# Patient Record
Sex: Female | Born: 1965
Health system: Southern US, Community
[De-identification: ages and names within clinical notes are randomized; demographics above are authoritative.]

## PROBLEM LIST (undated history)

## (undated) DIAGNOSIS — K219 Gastro-esophageal reflux disease without esophagitis: Secondary | ICD-10-CM

## (undated) HISTORY — PX: BACK SURGERY: SHX140

---

## 1998-01-01 ENCOUNTER — Other Ambulatory Visit: Admission: RE | Admit: 1998-01-01 | Discharge: 1998-01-01 | Payer: Self-pay | Admitting: Obstetrics & Gynecology

## 2002-10-01 ENCOUNTER — Encounter: Payer: Self-pay | Admitting: *Deleted

## 2002-10-01 ENCOUNTER — Encounter (HOSPITAL_COMMUNITY): Admission: RE | Admit: 2002-10-01 | Discharge: 2002-10-31 | Payer: Self-pay | Admitting: *Deleted

## 2006-10-23 ENCOUNTER — Ambulatory Visit (HOSPITAL_COMMUNITY): Admission: RE | Admit: 2006-10-23 | Discharge: 2006-10-23 | Payer: Self-pay | Admitting: Family Medicine

## 2006-12-24 ENCOUNTER — Inpatient Hospital Stay (HOSPITAL_COMMUNITY): Admission: RE | Admit: 2006-12-24 | Discharge: 2006-12-25 | Payer: Self-pay | Admitting: Neurosurgery

## 2010-11-29 NOTE — Op Note (Signed)
NAMEKEVEN, SOUCY              ACCOUNT NO.:  [id number]   MEDICAL RECORD NO.:  [id number]          PATIENT TYPE:  INP   LOCATION:  3172                         FACILITY:  [facility]   PHYSICIAN:  Hewitt Shorts, M.D.DATE OF BIRTH:  1965-10-18   DATE OF PROCEDURE:  12/24/2006  DATE OF DISCHARGE:                               OPERATIVE REPORT   PREOPERATIVE DIAGNOSIS:  Right L5-S1 lumbar disk herniation, lumbar  spondylosis, lumbar degenerative disk disease and lumbar radiculopathy.   POSTOPERATIVE DIAGNOSIS:  Right L5-S1 lumbar disk herniation, lumbar  spondylosis, lumbar degenerative disk disease and lumbar radiculopathy.   PROCEDURE:  Right L5-S1 lumbar laminotomy and microdiskectomy with  microdissection.   SURGEON:  Hewitt Shorts, M.D.   ASSISTANT:  Nelia Shi. Webb Silversmith, RN and Clydene Fake, M.D.   ANESTHESIA:  General endotracheal.   INDICATIONS:  The patient is a 45 year old woman who presented with a  right lumbar radiculopathy.  MRI scan revealed small-like disk  herniation in a broad-based fashion central to the right edge of L5-S1  with fragment, had migrated caudally behind the body of S1 compressing  the exiting right S1 nerve root.  Decision made to proceed with  laminotomy and microdiskectomy.   DESCRIPTION OF PROCEDURE:  The patient was brought to the operating room  and placed under general endotracheal anesthesia.  The patient was  turned to a prone position.  Lumbar region was prepped with Betadine  soap and solution and draped in a sterile fashion.  The midline was  treated with local anesthesia with epinephrine.  An x-ray was taken at  the L5-S1 level identified and dissection was carried down to the  subcutaneous tissue.  Bipolar cautery, electrocautery was used to  maintain hemostasis.  Dissection was carried down to the lumbar fascia  which was incised in the right side of the midline of the paraspinous  muscles.  Resection of spinous process and  lamina in a subperiosteal  fashion.  The L5-S1 interlaminar space was identified, localized and x-  rays taken and localization confirmed.  We then draped the microscope  which was brought into the field and provided magnification and  illumination and visualization and the remainder of decompression was  performed using microdissection, microsurgical technique.   A laminotomy was performed using the Ex-Max drill and Kerrison punches.  Ligament of flavum was carefully removed and we identified the thecal  sac and exiting right S1 nerve root.  These structures were gently  retracted medially and a small-like disk herniation at the disk space  level was identified as well as the fragment that had migrated caudally  behind the body of S1 compressing the exiting right S1 nerve root.   Diskectomy was begun by removing the free fragment that migrated  caudally and then we proceeded with incision of the annulus with  thorough diskectomy using a variety of microcurets and pituitary  rongeurs.  There was significant spondylytic degeneration of the disk  and we were able to remove the disk herniation and decompress the thecal  sac and nerve root.  In the end a thorough diskectomy was performed  using a variety of instruments and good decompression of the neural  structures was achieved.  We then irrigated the wound with bacitracin  solution establishing hemostasis with the use of bipolar cautery and  then once decompression was completed, hemostasis was established, we  instilled 2 mL of fentanyl, 80 mg of Depo-Medrol into the epidural space  and proceeded with closure.  The deep fascia closed with interrupted  undyed 1 Vicryl suture, Scarpa's fascia closed with interrupted undyed 1  Vicryl sutures, the subcutaneous subcuticular closed with inverted 2-0  and 3-0 undyed Vicryl sutures and the skin was approximated with  Dermabond.  The procedure was tolerated well.  The estimated blood loss  was  25 mL.  Sponge count correct.  Following surgery the patient was to  be turned back to supine position, reversed from anesthetized, extubated  and transferred to the recovery room for further care.      Hewitt Shorts, M.D.  Electronically Signed     RWN/MEDQ  D:  12/24/2006  T:  12/24/2006  Job:  [id number]

## 2010-12-02 NOTE — Procedures (Signed)
   Danielle Duke, Danielle Duke                        ACCOUNT NO.:  [id number]   MEDICAL RECORD NO.:  [id number]                    PATIENT TYPE:   LOCATION:                                       FACILITY:   PHYSICIAN:  Kem Boroughs, M.D.                 DATE OF BIRTH:   DATE OF PROCEDURE:  10/01/2002  DATE OF DISCHARGE:                                    STRESS TEST   INDICATIONS:  The patient is a 45 year old white female who was referred for  evaluation of chest discomfort.  She was subsequently referred for nuclear  stress test today.   Baseline 12-lead electrocardiogram reveals normal sinus rhythm.  Resting  blood pressure was 110/62 with a pulse of 84 beats per minute.  The patient  walked for eight minutes on a full Bruce protocol surpassing her target  heart rate of 156 beats per minute and proceeding to a peak heart rate of  161 beats per minute for 88% of her predicted maximum.  She achieved a peak  work load of 10.1 METS.  She denied any chest discomfort with this  procedure.  She did stop secondary to shortness of breath and lower  extremity fatigue.  There were no significant arrhythmias noted during this  procedure.  Electrocardiogram at peak heart rate revealed 1 mm upsloping ST  depression inferiorly and anteriorly (V3-V5).  She recovered within two  minutes of recovery back to normal sinus rhythm.  Her ST depression  normalized within this time frame as well.  She suffered no further  symptoms.   IMPRESSION:  1. Electrocardiogram negative for ischemia.  2. Clinically negative for angina.  3. Overall reasonable exercise tolerance.  4. Scintigraphic images are pending.                                               Kem Boroughs, M.D.    TB/MEDQ  D:  10/01/2002  T:  10/01/2002  Job:  [id number]   cc:   Corrie Mckusick, M.D.  [address] [place]. [address]  [place]  [place] [address]  Fax: [phone]

## 2011-05-04 LAB — CBC
HCT: 36.1
Hemoglobin: 12.3
MCHC: 34.1
MCV: 86.9
Platelets: 245
RBC: 4.15
RDW: 14.3 — ABNORMAL HIGH
WBC: 6.7

## 2011-05-04 LAB — HCG, SERUM, QUALITATIVE: Preg, Serum: NEGATIVE

## 2011-12-07 ENCOUNTER — Ambulatory Visit (INDEPENDENT_AMBULATORY_CARE_PROVIDER_SITE_OTHER): Payer: 59 | Admitting: Family Medicine

## 2011-12-07 ENCOUNTER — Ambulatory Visit: Payer: 59

## 2011-12-07 VITALS — BP 96/60 | HR 68 | Temp 98.5°F | Resp 16 | Ht 66.25 in | Wt 181.0 lb

## 2011-12-07 DIAGNOSIS — Z0000 Encounter for general adult medical examination without abnormal findings: Secondary | ICD-10-CM

## 2011-12-07 DIAGNOSIS — M79646 Pain in unspecified finger(s): Secondary | ICD-10-CM

## 2011-12-07 DIAGNOSIS — S6990XA Unspecified injury of unspecified wrist, hand and finger(s), initial encounter: Secondary | ICD-10-CM

## 2011-12-07 DIAGNOSIS — S6991XA Unspecified injury of right wrist, hand and finger(s), initial encounter: Secondary | ICD-10-CM

## 2011-12-07 DIAGNOSIS — Z23 Encounter for immunization: Secondary | ICD-10-CM

## 2011-12-07 DIAGNOSIS — M79609 Pain in unspecified limb: Secondary | ICD-10-CM

## 2011-12-07 LAB — POCT CBC
Granulocyte percent: 68.2 %G (ref 37–80)
HCT, POC: 42.1 % (ref 37.7–47.9)
Lymph, poc: 1.9 (ref 0.6–3.4)
MCHC: 32.5 g/dL (ref 31.8–35.4)
MID (cbc): 0.4 (ref 0–0.9)
MPV: 9.7 fL (ref 0–99.8)
POC Granulocyte: 4.9 (ref 2–6.9)
POC LYMPH PERCENT: 26.3 %L (ref 10–50)
POC MID %: 5.5 %M (ref 0–12)
Platelet Count, POC: 242 10*3/uL (ref 142–424)
RDW, POC: 15.3 %

## 2011-12-07 LAB — COMPREHENSIVE METABOLIC PANEL
AST: 16 U/L (ref 0–37)
Albumin: 4.5 g/dL (ref 3.5–5.2)
BUN: 12 mg/dL (ref 6–23)
CO2: 26 mEq/L (ref 19–32)
Calcium: 9.1 mg/dL (ref 8.4–10.5)
Chloride: 105 mEq/L (ref 96–112)
Creat: 0.73 mg/dL (ref 0.50–1.10)
Glucose, Bld: 87 mg/dL (ref 70–99)
Potassium: 4.1 mEq/L (ref 3.5–5.3)

## 2011-12-07 LAB — LIPID PANEL
Cholesterol: 142 mg/dL (ref 0–200)
Triglycerides: 128 mg/dL (ref <150)

## 2011-12-07 NOTE — Progress Notes (Signed)
  Subjective:    Patient ID: Danielle Duke, female    DOB: [birth year], 46 y.o.   MRN: [id number]  HPI Danielle Duke is a 46 y.o. female Here for physical  - new to our office.   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  No known med problems.  No recent labs, no recent primary care.  Last physical 1 year ago - at health dept.  Last ppd July 2012 - normal.   See immunization record/forms.  Had 1st 2 doses of Hep B - last dose September, due in March - will have done today.  MMR titer - 11/30/10 - immune by report (had varicella and MMR at [facility]. Health dept.) Back surgery - 3 or 4 years ago.  Cleared for work.  Doing ok.  R 3rd finger pain -caught on toilet as fell through floor in bathroom, 1 week ago, finger folded in.  Bruising and swollen - no prior eval. Has been using foldover brace.  Sore into hand at times.  R hand dominant.  Sister diagnosed with melanoma.  No new moles.  Derm: Dr. Margo Aye - last ov few years ago.     Last pap - few years ago - normal.  Fasting this am. Borderline   Review of Systems See scanned copy of PHS - R 3rd finger pain, no other concerns.     Objective:   Physical Exam  Constitutional: She is oriented to person, place, and time. She appears well-developed and well-nourished.  HENT:  Head: Normocephalic and atraumatic.  Right Ear: External ear normal.  Left Ear: External ear normal.  Mouth/Throat: Oropharynx is clear and moist.  Eyes: Conjunctivae and EOM are normal. Pupils are equal, round, and reactive to light.  Neck: Normal range of motion. No tracheal deviation present. No thyromegaly present.  Cardiovascular: Normal rate, regular rhythm, normal heart sounds and intact distal pulses.   No murmur heard. Pulmonary/Chest: Effort normal and breath sounds normal. No respiratory distress.  Abdominal: Soft. Bowel sounds are normal. There is no tenderness. There is no rebound.    Musculoskeletal: Normal range of motion. She exhibits no edema and no tenderness.       Hands: Lymphadenopathy:    She has no cervical adenopathy.  Neurological: She is alert and oriented to person, place, and time.  Skin: Skin is warm and dry. No rash noted. No erythema.  Psychiatric: She has a normal mood and affect. Her behavior is normal.    [facility] reading (PRIMARY) by  Dr. Neva Seat: R 3rd finger. Small volar plate avulsion at base of 3rd middle phalynx.  No apparent incongruity/subluxation      Assessment & Plan:  [name] is a 46 y.o. female CPE - check pap2, declined std testing.  Check CMP, lipids, cbc.  Can schedule repeat mammogram on own.  Follow up with dermatologist for mole mapping with FH melanoma. HepB vaccine # 3 given Ppd placed - read in 48-72 hours. Paperwork for school completed - see scanned copy.  Pt to obtain copy of varicella and MMR titers for school.    R 3rd finger pain/swelling- suspected volar plate avulsion fracture without apparent subluxation.  - buddy taped with dorsal padded splint, in slight flexion for next 4-5  days, then rom as tolerated.  Recheck next 2 weeks. Advised that swelling may persist for 6-12 months.

## 2011-12-09 ENCOUNTER — Encounter (INDEPENDENT_AMBULATORY_CARE_PROVIDER_SITE_OTHER): Payer: 59

## 2011-12-09 DIAGNOSIS — Z111 Encounter for screening for respiratory tuberculosis: Secondary | ICD-10-CM

## 2011-12-09 LAB — TB SKIN TEST: Induration: 0 mm

## 2011-12-15 LAB — PAP IG W/ RFLX HPV ASCU

## 2011-12-16 ENCOUNTER — Encounter: Payer: Self-pay | Admitting: *Deleted

## 2013-04-30 ENCOUNTER — Ambulatory Visit (INDEPENDENT_AMBULATORY_CARE_PROVIDER_SITE_OTHER): Payer: 59 | Admitting: Family Medicine

## 2013-04-30 ENCOUNTER — Encounter: Payer: Self-pay | Admitting: Family Medicine

## 2013-04-30 VITALS — BP 120/72 | Temp 98.3°F | Ht 67.0 in | Wt 184.8 lb

## 2013-04-30 DIAGNOSIS — J329 Chronic sinusitis, unspecified: Secondary | ICD-10-CM

## 2013-04-30 MED ORDER — AMOXICILLIN-POT CLAVULANATE 875-125 MG PO TABS: 1.0000 | ORAL_TABLET | Freq: Two times a day (BID) | ORAL | Status: AC

## 2013-04-30 MED ORDER — FLUTICASONE PROPIONATE 50 MCG/ACT NA SUSP: 1.0000 | Freq: Every day | NASAL | Status: DC

## 2013-04-30 NOTE — Progress Notes (Signed)
  Subjective:    Patient ID: Danielle Duke, female    DOB: Feb 07, 1966, 47 y.o.   MRN: [id number]  Sore Throat  This is a new problem. The current episode started in the past 7 days. The problem has been gradually worsening. The pain is mild. Associated symptoms include congestion, coughing, ear pain, a hoarse voice and swollen glands. She has tried nothing for the symptoms.   Dec energy, achey, and not feelig well,  Long term ear fullness and discomfort,  Took mucinex and sudafed prn   Pressure in front, trouble with ears,  Review of Systems  HENT: Positive for congestion, ear pain and hoarse voice.   Respiratory: Positive for cough.    no vomiting no diarrhea ROS otherwise negative     Objective:   Physical Exam  Alert mild malaise HEENT moderate nasal congestion pharynx erythematous frontal tenderness. Neck supple. Lungs clear. Heart regular in rhythm.      Assessment & Plan:  Impression rhinosinusitis #2 allergic rhinitis discussed add Flonase 2 sprays each nostril daily. Augmentin 1 tablet twice a day 10 days. Symptomatic care discussed. WSL

## 2013-05-19 ENCOUNTER — Other Ambulatory Visit: Payer: Self-pay | Admitting: *Deleted

## 2013-05-19 ENCOUNTER — Ambulatory Visit (INDEPENDENT_AMBULATORY_CARE_PROVIDER_SITE_OTHER): Payer: 59 | Admitting: Nurse Practitioner

## 2013-05-19 ENCOUNTER — Encounter: Payer: Self-pay | Admitting: Nurse Practitioner

## 2013-05-19 VITALS — BP 130/82 | Temp 98.2°F | Ht 67.0 in | Wt 184.2 lb

## 2013-05-19 DIAGNOSIS — N766 Ulceration of vulva: Secondary | ICD-10-CM

## 2013-05-19 DIAGNOSIS — B009 Herpesviral infection, unspecified: Secondary | ICD-10-CM

## 2013-05-19 DIAGNOSIS — B373 Candidiasis of vulva and vagina: Secondary | ICD-10-CM

## 2013-05-19 DIAGNOSIS — R300 Dysuria: Secondary | ICD-10-CM

## 2013-05-19 DIAGNOSIS — N940 Mittelschmerz: Secondary | ICD-10-CM

## 2013-05-19 LAB — POCT URINALYSIS DIPSTICK
Blood, UA: 50
Spec Grav, UA: 1.02
Spec Grav, UA: 1.02
pH, UA: 6
pH, UA: 6

## 2013-05-19 MED ORDER — VALACYCLOVIR HCL 1 G PO TABS: 1000.0000 mg | ORAL_TABLET | Freq: Two times a day (BID) | ORAL | Status: DC

## 2013-05-22 ENCOUNTER — Encounter: Payer: Self-pay | Admitting: Nurse Practitioner

## 2013-05-22 ENCOUNTER — Telehealth: Payer: Self-pay | Admitting: Nurse Practitioner

## 2013-05-22 DIAGNOSIS — B009 Herpesviral infection, unspecified: Secondary | ICD-10-CM

## 2013-05-22 LAB — POCT WET PREP WITH KOH
Clue Cells Wet Prep HPF POC: NEGATIVE
Epithelial Wet Prep HPF POC: POSITIVE
KOH Prep POC: NEGATIVE
RBC Wet Prep HPF POC: NEGATIVE
Trichomonas, UA: NEGATIVE
Yeast Wet Prep HPF POC: POSITIVE

## 2013-05-22 NOTE — Telephone Encounter (Signed)
Patient is being notified about infection. Encouraged her to think about further STD testing.

## 2013-05-22 NOTE — Progress Notes (Signed)
Subjective:  Presents with complaints of lesions external genital area for the past 2 days. Patient is married but her husband is unable to perform sexually due to health issues. Does have a long-term partner for the past 10 years who has had a vasectomy. No other partners. Slight external burning with urination. No urgency or frequency. Mild low back pain. No fever. No vomiting or diarrhea. Mild left lower abdominal pain. Regular cycles, slightly heavy flow lasting 6-7 days. Her last cycle was approximately 2 weeks ago. Was recently on Augmentin, completed 8/10 days. Some white discharge with itching at times. Note patient is a smoker.  Objective:   BP 130/82  Temp(Src) 98.2 F (36.8 C) (Oral)  Ht 5\' 7"  (1.702 m)  Wt 184 lb 4 oz (83.575 kg)  BMI 28.85 kg/m2 NAD. Alert, oriented. Lungs clear. Heart regular rate rhythm. No CVA tenderness. Abdomen soft nondistended with mild left area pelvic tenderness. External GU several small shallow ulcerations noted on the labia. Very tender while obtaining culture. Vagina moderate amount of white mucoid discharge noted. Cervix normal limit in appearance. No CMT. Uterus and adnexa mild tenderness towards the left pelvic area, no obvious masses. Patient initially brought her own urine sample in a small pill bottle. A clean-catch was obtained in the office for UA which was negative. Urine pregnancy test negative. Wet prep pH 4.5 with yeast noted. Rare WBC.  Assessment:Dysuria - Plan: POCT urinalysis dipstick, POCT urine pregnancy, POCT urinalysis dipstick, CANCELED: POCT UA - Microscopic Only  Ulcer of genital labia - Plan: Herpes simplex virus culture  Vaginal candidiasis  Ovulatory pain  Probable genital herpes, culture pending  Plan: Meds ordered this encounter  Medications  . loratadine (CLARITIN) 10 MG tablet    Sig: Take 10 mg by mouth daily.  Marland Kitchen DISCONTD: valACYclovir (VALTREX) 1000 MG tablet    Sig: Take 1 tablet (1,000 mg total) by mouth 2 (two)  times daily.    Dispense:  20 tablet    Refill:  0    Order Specific Question:  Supervising Provider    Answer:  Merlyn Albert [[id number]]   Discussed safe sex issues. Patient defers further STD testing as of today. Further followup based on culture report. Feel that mild pelvic pain is most likely ovulatory considering time of her cycle, call back if persists. Warning signs were reviewed.

## 2013-05-22 NOTE — Assessment & Plan Note (Signed)
Valtrex as directed. Patient defers further STD testing as of today.

## 2013-06-05 ENCOUNTER — Telehealth: Payer: Self-pay | Admitting: Nurse Practitioner

## 2013-06-05 NOTE — Telephone Encounter (Signed)
Patient would like for Danielle Duke to call her as soon as possible so she can talk with her about medication.

## 2013-06-09 ENCOUNTER — Other Ambulatory Visit: Payer: Self-pay | Admitting: Family Medicine

## 2013-06-09 NOTE — Telephone Encounter (Signed)
Patient still would like to talk with Eber Jones.  Needs a refill on valACYclovir (VALTREX) 1000 MG tablet sent to [facility] in [place]

## 2013-06-09 NOTE — Telephone Encounter (Signed)
Patient would like this today if any way possible

## 2013-06-27 ENCOUNTER — Telehealth: Payer: Self-pay | Admitting: Family Medicine

## 2013-06-27 NOTE — Telephone Encounter (Signed)
Patient wants you to call her its personnal and wants to speak to you as soon as possible.

## 2013-08-20 ENCOUNTER — Telehealth: Payer: Self-pay | Admitting: Family Medicine

## 2013-08-20 ENCOUNTER — Encounter: Payer: Self-pay | Admitting: Family Medicine

## 2013-08-20 MED ORDER — ONDANSETRON 4 MG PO TBDP
4.0000 mg | ORAL_TABLET | Freq: Four times a day (QID) | ORAL | Status: DC | PRN
Start: 2013-08-20 — End: 2016-12-25

## 2013-08-20 NOTE — Telephone Encounter (Signed)
Pt having abd cramping, nausea and diarrhea Had to leave work at [employer] from it, would like a  Work excuse (for tonight, 08/20/13)  as well as something called in for the  Nausea. Thinks she just has a stomach bug.   [place]

## 2013-08-20 NOTE — Telephone Encounter (Signed)
Med was sent. Patient notified via VM.

## 2013-08-20 NOTE — Telephone Encounter (Signed)
Ok. zof 4 odt twenty one q 6 prn nausea

## 2013-08-20 NOTE — Telephone Encounter (Signed)
W/E done  °

## 2013-08-25 ENCOUNTER — Other Ambulatory Visit: Payer: Self-pay | Admitting: Nurse Practitioner

## 2013-08-25 MED ORDER — CLOBETASOL PROPIONATE 0.05 % EX CREA: 1.0000 "application " | TOPICAL_CREAM | Freq: Two times a day (BID) | CUTANEOUS | Status: DC

## 2013-12-14 ENCOUNTER — Emergency Department (HOSPITAL_COMMUNITY)
Admission: EM | Admit: 2013-12-14 | Discharge: 2013-12-14 | Disposition: A | Discharge status: Home / Self Care | Payer: 59 | Source: Home / Self Care | Attending: Family Medicine | Admitting: Family Medicine

## 2013-12-14 ENCOUNTER — Encounter (HOSPITAL_COMMUNITY): Payer: Self-pay | Admitting: Emergency Medicine

## 2013-12-14 DIAGNOSIS — M65839 Other synovitis and tenosynovitis, unspecified forearm: Secondary | ICD-10-CM

## 2013-12-14 DIAGNOSIS — M778 Other enthesopathies, not elsewhere classified: Secondary | ICD-10-CM

## 2013-12-14 MED ORDER — TRAMADOL HCL 50 MG PO TABS: 50.0000 mg | ORAL_TABLET | Freq: Four times a day (QID) | ORAL | Status: DC | PRN

## 2013-12-14 MED ORDER — PREDNISONE 10 MG PO KIT: PACK | ORAL | Status: DC

## 2013-12-14 NOTE — ED Provider Notes (Signed)
Danielle Duke is a 48 y.o. female who presents to Urgent Care today for right wrist pain occurring over the past one week worsening over the past few days. Pain is worse with thumb motion. No injury. Radiating pain weakness or numbness. Patient tried ibuprofen which has not helped. The pain is worse with activity.   History reviewed. No pertinent past medical history. History  Substance Use Topics  . Smoking status: Current Every Day Smoker -- 0.60 packs/day    Types: Cigarettes  . Smokeless tobacco: Not on file  . Alcohol Use: Not on file   ROS as above Medications: No current facility-administered medications for this encounter.   Current Outpatient Prescriptions  Medication Sig Dispense Refill  . ibuprofen (ADVIL,MOTRIN) 200 MG tablet Take 200 mg by mouth every 6 (six) hours as needed.      . clobetasol cream (TEMOVATE) 4.18 % Apply 1 application topically 2 (two) times daily. Prn eczema up to 2 weeks at a time  45 g  0  . fluticasone (FLONASE) 50 MCG/ACT nasal spray Place 1 spray into the nose daily.  16 g  5  . loratadine (CLARITIN) 10 MG tablet Take 10 mg by mouth daily.      . ondansetron (ZOFRAN ODT) 4 MG disintegrating tablet Take 1 tablet (4 mg total) by mouth every 6 (six) hours as needed for nausea or vomiting.  20 tablet  0  . PredniSONE 10 MG KIT 12 day dose pack po  1 kit  0  . traMADol (ULTRAM) 50 MG tablet Take 1 tablet (50 mg total) by mouth every 6 (six) hours as needed.  10 tablet  0  . valACYclovir (VALTREX) 1000 MG tablet TAKE 1 TABLET BY MOUTH TWICE DAILY  20 tablet  2    Exam:  BP 126/74  Pulse 75  Temp(Src) 98.1 F (36.7 C) (Oral)  Resp 18  SpO2 100%  LMP 12/04/2013 Gen: Well NAD Right upper extremity:  Shoulder is nontender with normal range of motion Elbow is nontender with full range of motion Right wrist. Tender palpation about 4 cm proximal to the radial styloid with thumb motion. Palpable squeak present. Negative Finkelstein status. Snuffbox is  nontender. Wrist motion is normal otherwise. Pulses capillary refill and sensation are intact distally. Normal grip strength.  No results found for this or any previous visit (from the past 24 hour(s)). No results found.  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 Plan to treat with prednisone thumb spica splint and tramadol. Work note provided. All up with sports medicine if not better.  Discussed warning signs or symptoms. Please see discharge instructions. Patient expresses understanding.    Gregor Hams, MD 12/14/13 [phone]

## 2013-12-14 NOTE — ED Notes (Signed)
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

## 2013-12-14 NOTE — Discharge Instructions (Signed)
Thank you for coming in today. Use the brace for 1 week.  Use tramadol for pain as needed.  Do not work while taking this medicine.  Follow up with Dr. Tamala Julian as needed.

## 2013-12-29 ENCOUNTER — Encounter: Payer: Self-pay | Admitting: Family Medicine

## 2013-12-29 ENCOUNTER — Other Ambulatory Visit: Payer: Self-pay | Admitting: Nurse Practitioner

## 2013-12-29 ENCOUNTER — Ambulatory Visit (INDEPENDENT_AMBULATORY_CARE_PROVIDER_SITE_OTHER): Payer: 59 | Admitting: Family Medicine

## 2013-12-29 VITALS — BP 119/78 | Ht 67.0 in | Wt 177.0 lb

## 2013-12-29 DIAGNOSIS — M25539 Pain in unspecified wrist: Secondary | ICD-10-CM

## 2013-12-30 DIAGNOSIS — M25539 Pain in unspecified wrist: Secondary | ICD-10-CM | POA: Insufficient documentation

## 2013-12-30 NOTE — Progress Notes (Signed)
Patient ID: Danielle Duke, female   DOB: Dec 05, 1965, 48 y.o.   MRN: [id number]  Danielle Duke - 48 y.o. female MRN [id number]  Date of birth: 1965/07/18    SUBJECTIVE:      Follow up right wrist pain. Was seen at urgent care and diagnosed with intersection syndrome. They gave her a steroid burst and put her in a brace. She is 90% better. She is anxious to return to work and cannot do so until we release her. Her pain is minimal with activity now. She still has a little bit of pain with wrist extension but it's 1-2/10 and months. No more swelling. ROS:     No numbness or tingling in her hand. No hand weakness. No redness of the joint.  PERTINENT  PMH / PSH FH / / SH:  Past Medical, Surgical, Social, and Family History Reviewed & Updated per EMR.  Pertinent Historical Findings include:  No prior history of wrist surgery or injury  OBJECTIVE: BP 119/78  Ht 5\' 7"  (1.702 m)  Wt 177 lb (80.287 kg)  BMI 27.72 kg/m2  LMP 12/04/2013  Physical Exam:  Vital signs are reviewed. WRIST: Right. Normal range of motion. Very slight pain with resisted wrist extension strength is full. ULTRASOUND: Normal wrist joint without any sign of effusion. Minimal to mild arthritic change. Deep within the third compartment on the dorsal person of the wrist is looks like debris from a prior ruptured ganglion cyst.  ASSESSMENT & PLAN:  See problem based charting & AVS for pt instructions. Wrist pain. I do not think this was intersection syndrome. It seems more like either her wrist strain or perhaps it was a ganglion cyst that has subsequently ruptured. Either way I think itis OLK o go back to work---- will give her paperwork for that today. She'll followup when necessary

## 2014-06-12 ENCOUNTER — Other Ambulatory Visit: Payer: Self-pay | Admitting: Family Medicine

## 2014-06-17 ENCOUNTER — Telehealth: Payer: Self-pay | Admitting: Family Medicine

## 2014-06-17 ENCOUNTER — Ambulatory Visit (INDEPENDENT_AMBULATORY_CARE_PROVIDER_SITE_OTHER): Payer: 59 | Admitting: Nurse Practitioner

## 2014-06-17 ENCOUNTER — Encounter: Payer: Self-pay | Admitting: Nurse Practitioner

## 2014-06-17 VITALS — BP 118/72 | Ht 67.0 in | Wt 194.2 lb

## 2014-06-17 DIAGNOSIS — Z0000 Encounter for general adult medical examination without abnormal findings: Secondary | ICD-10-CM

## 2014-06-17 MED ORDER — VALACYCLOVIR HCL 1 G PO TABS: 1000.0000 mg | ORAL_TABLET | Freq: Every day | ORAL | Status: DC

## 2014-06-17 NOTE — Progress Notes (Signed)
   Subjective:    Patient ID: SICILY ZARAGOZA, female    DOB: Jun 17, 1966, 48 y.o.   MRN: [id number]  HPI presents for wellness exam. Fairly regular cycles, 4-5 days; 3 days heavy; clots at times. Same sexual partner. Regular vision and dental exams. No regular exercise but going to school and working. Not doing well with her diet. Has cut back on her smoking; trying to quit. No recent herpes outbreaks but would like to start daily suppressive therapy.     Review of Systems  Constitutional: Negative for fever, activity change, appetite change and fatigue.  HENT: Negative for dental problem, ear pain, sinus pressure and sore throat.   Respiratory: Negative for cough, chest tightness, shortness of breath and wheezing.   Cardiovascular: Negative for chest pain.  Gastrointestinal: Negative for nausea, vomiting, abdominal pain, diarrhea, constipation and abdominal distention.  Genitourinary: Negative for dysuria, urgency, frequency, vaginal discharge, enuresis, difficulty urinating, genital sores, menstrual problem and pelvic pain.       Objective:   Physical Exam  Constitutional: She is oriented to person, place, and time. She appears well-developed. No distress.  HENT:  Right Ear: External ear normal.  Left Ear: External ear normal.  Mouth/Throat: Oropharynx is clear and moist.  Neck: Normal range of motion. Neck supple. No tracheal deviation present. No thyromegaly present.  Cardiovascular: Normal rate, regular rhythm and normal heart sounds.  Exam reveals no gallop.   No murmur heard. Pulmonary/Chest: Effort normal and breath sounds normal.  Abdominal: Soft. She exhibits no distension. There is no tenderness.  Genitourinary: Vagina normal and uterus normal. No vaginal discharge found.  External GU: no rashes or lesions. Vagina: no discharge. No CMT. Bimanual exam: no tenderness or obvious masses.   Musculoskeletal: She exhibits no edema.  Lymphadenopathy:    She has no cervical  adenopathy.  Neurological: She is alert and oriented to person, place, and time.  Skin: Skin is warm and dry. No rash noted.  Psychiatric: She has a normal mood and affect. Her behavior is normal.  Vitals reviewed. Breast exam: no masses; axillae no adenopathy.        Assessment & Plan:  Routine general medical examination at a health care facility - Plan: Lipid panel, Hepatic function panel, Basic metabolic panel  Patient wants to schedule her own mammogram. Recommend continued smoking cessation efforts. Discussed skin cancer prevention and detection. Recommend regular activity, healthy diet and weight loss. Daily vitamin D and calcium supplementation.  Meds ordered this encounter  Medications  . valACYclovir (VALTREX) 1000 MG tablet    Sig: Take 1 tablet (1,000 mg total) by mouth daily.    Dispense:  90 tablet    Refill:  3    Order Specific Question:  Supervising Provider    Answer:  Mikey Kirschner [[id number]]   Return in about 1 year (around 06/18/2015).

## 2014-06-17 NOTE — Telephone Encounter (Signed)
Patient would like a copy of her physical when the OV note is done. Please advise.

## 2014-06-19 NOTE — Telephone Encounter (Signed)
Patient states that she left a form with Hoyle Sauer to complete for her clinicals for school. She is requesting to pick that up on Monday next week.

## 2014-06-21 NOTE — Telephone Encounter (Signed)
Danae Chen, please check for PE form. It was not on my desk Thursday but may be there now. Thanks. Hoyle Sauer

## 2014-06-23 NOTE — Telephone Encounter (Signed)
Danielle Duke, just wanted to document that you sent a copy of PE to patient.

## 2014-08-22 ENCOUNTER — Other Ambulatory Visit: Payer: Self-pay | Admitting: Family Medicine

## 2014-09-02 ENCOUNTER — Encounter: Payer: Self-pay | Admitting: Family Medicine

## 2014-09-02 ENCOUNTER — Ambulatory Visit (HOSPITAL_COMMUNITY)
Admission: RE | Admit: 2014-09-02 | Discharge: 2014-09-02 | Disposition: A | Discharge status: Home / Self Care | Payer: 59 | Source: Ambulatory Visit | Attending: Family Medicine | Admitting: Family Medicine

## 2014-09-02 ENCOUNTER — Ambulatory Visit (INDEPENDENT_AMBULATORY_CARE_PROVIDER_SITE_OTHER): Payer: 59 | Admitting: Family Medicine

## 2014-09-02 VITALS — BP 140/88 | Ht 67.0 in | Wt 182.0 lb

## 2014-09-02 DIAGNOSIS — W2189XA Striking against or struck by other sports equipment, initial encounter: Secondary | ICD-10-CM | POA: Insufficient documentation

## 2014-09-02 DIAGNOSIS — M79674 Pain in right toe(s): Secondary | ICD-10-CM | POA: Diagnosis present

## 2014-09-02 DIAGNOSIS — S92414A Nondisplaced fracture of proximal phalanx of right great toe, initial encounter for closed fracture: Secondary | ICD-10-CM | POA: Diagnosis not present

## 2014-09-02 MED ORDER — HYDROCODONE-ACETAMINOPHEN 7.5-325 MG PO TABS: 1.0000 | ORAL_TABLET | Freq: Four times a day (QID) | ORAL | Status: DC | PRN

## 2014-09-02 NOTE — Progress Notes (Signed)
   Subjective:    Patient ID: Danielle Duke, female    DOB: January 29, 1966, 49 y.o.   MRN: [id number]  HPI Patient has what looks a broken toe. Patient stated that she doing kicking box with a punch bag. And her form was improper.   Review of Systems Relates pain discomfort in her foot throbbing swelling    Objective:   Physical Exam Has swelling tenderness in the right great toe the rest of the foot ankle are normal calf is normal       Assessment & Plan:  Toe fracture nondisplaced recommend hard sole shoe. Will have to wear this for 6-8 weeks. May have to go toward a boot if not showing signs of healing with the x-ray in 3 weeks. Follow-up in a few weeks repeat x-ray.

## 2014-09-03 ENCOUNTER — Telehealth: Payer: Self-pay | Admitting: Family Medicine

## 2014-09-03 NOTE — Telephone Encounter (Signed)
Pt called back stating that she no longer needs the script. Pharmacy told her that ins won't pay for it anyway.

## 2014-09-03 NOTE — Telephone Encounter (Signed)
Pt was supposed to have a script written for a boot to wear due to her broken toe  Please fax to [facility]

## 2015-03-15 ENCOUNTER — Other Ambulatory Visit: Payer: Self-pay | Admitting: Family Medicine

## 2015-08-10 DIAGNOSIS — H31002 Unspecified chorioretinal scars, left eye: Secondary | ICD-10-CM | POA: Diagnosis not present

## 2015-08-10 DIAGNOSIS — H52223 Regular astigmatism, bilateral: Secondary | ICD-10-CM | POA: Diagnosis not present

## 2015-08-10 DIAGNOSIS — H5213 Myopia, bilateral: Secondary | ICD-10-CM | POA: Diagnosis not present

## 2015-08-10 DIAGNOSIS — H524 Presbyopia: Secondary | ICD-10-CM | POA: Diagnosis not present

## 2015-09-21 ENCOUNTER — Other Ambulatory Visit: Payer: Self-pay | Admitting: Family Medicine

## 2016-03-16 ENCOUNTER — Encounter: Payer: Self-pay | Admitting: Nurse Practitioner

## 2016-03-16 ENCOUNTER — Ambulatory Visit (INDEPENDENT_AMBULATORY_CARE_PROVIDER_SITE_OTHER): Payer: 59 | Admitting: Nurse Practitioner

## 2016-03-16 ENCOUNTER — Telehealth: Payer: Self-pay | Admitting: Family Medicine

## 2016-03-16 VITALS — BP 128/70 | Ht 66.5 in | Wt 173.5 lb

## 2016-03-16 DIAGNOSIS — Z13220 Encounter for screening for lipoid disorders: Secondary | ICD-10-CM | POA: Diagnosis not present

## 2016-03-16 DIAGNOSIS — Z124 Encounter for screening for malignant neoplasm of cervix: Secondary | ICD-10-CM

## 2016-03-16 DIAGNOSIS — R5383 Other fatigue: Secondary | ICD-10-CM | POA: Diagnosis not present

## 2016-03-16 DIAGNOSIS — Z1231 Encounter for screening mammogram for malignant neoplasm of breast: Secondary | ICD-10-CM

## 2016-03-16 DIAGNOSIS — Z0000 Encounter for general adult medical examination without abnormal findings: Secondary | ICD-10-CM

## 2016-03-16 DIAGNOSIS — Z79899 Other long term (current) drug therapy: Secondary | ICD-10-CM

## 2016-03-16 DIAGNOSIS — Z1151 Encounter for screening for human papillomavirus (HPV): Secondary | ICD-10-CM | POA: Diagnosis not present

## 2016-03-16 NOTE — Telephone Encounter (Signed)
Pt dropped off a physical form to be filled out. Form in nurse box.

## 2016-03-16 NOTE — Patient Instructions (Signed)
Depo Provera Nexplanon Mirena

## 2016-03-17 ENCOUNTER — Encounter: Payer: Self-pay | Admitting: Nurse Practitioner

## 2016-03-17 NOTE — Progress Notes (Signed)
   Subjective:    Patient ID: Danielle Duke, female    DOB: July 16, 1966, 50 y.o.   MRN: [id number]  HPI presents for her wellness exam. Mostly regular cycles, heavy at times. Same sexual partner. No family history of colon cancer. No regular exercise but walks on occasion. Regular vision and dental care. Works for [employer], will be receiving her flu vaccine this fall.    Review of Systems  Constitutional: Positive for fatigue. Negative for activity change and appetite change.  HENT: Negative for dental problem, ear pain, sinus pressure and sore throat.   Respiratory: Negative for cough, chest tightness, shortness of breath and wheezing.   Cardiovascular: Negative for chest pain.  Gastrointestinal: Negative for abdominal distention, abdominal pain, constipation, diarrhea, nausea and vomiting.  Genitourinary: Negative for difficulty urinating, dysuria, enuresis, frequency, genital sores, menstrual problem, pelvic pain, urgency and vaginal discharge.  Skin: Positive for rash.       Dyshidrotic eczema on both hands with flareup mainly when she is at work and has to wear gloves. Improves when she is off work.       Objective:   Physical Exam  Constitutional: She is oriented to person, place, and time. She appears well-developed. No distress.  HENT:  Right Ear: External ear normal.  Left Ear: External ear normal.  Mouth/Throat: Oropharynx is clear and moist.  Neck: Normal range of motion. Neck supple. No tracheal deviation present. No thyromegaly present.  Cardiovascular: Normal rate, regular rhythm and normal heart sounds.  Exam reveals no gallop.   No murmur heard. Pulmonary/Chest: Effort normal and breath sounds normal.  Abdominal: Soft. She exhibits no distension. There is no tenderness.  Genitourinary: Vagina normal and uterus normal. No vaginal discharge found.  Genitourinary Comments: External GU no rashes or lesions. Vagina no discharge. Cervix normal limit in appearance, no CMT.  Bimanual exam no tenderness or obvious masses.  Musculoskeletal: She exhibits no edema.  Lymphadenopathy:    She has no cervical adenopathy.  Neurological: She is alert and oriented to person, place, and time.  Skin: Skin is warm and dry. Rash noted.  A few patches of dry skin on the hands, a small healing fissure in the palm of one hand.  Psychiatric: She has a normal mood and affect. Her behavior is normal.  Vitals reviewed.  Breast exam: Areas of dense tissue, no obvious masses. Axillae no adenopathy.        Assessment & Plan:  Routine general medical examination at a health care facility - Plan: Pap IG and HPV (high risk) DNA detection  Screening for cervical cancer - Plan: Pap IG and HPV (high risk) DNA detection  Screening for HPV (human papillomavirus) - Plan: Pap IG and HPV (high risk) DNA detection  Screening cholesterol level - Plan: Lipid panel  High risk medication use - Plan: Basic metabolic panel, Hepatic function panel  Other fatigue - Plan: CBC with Differential/Platelet, TSH, VITAMIN D 25 Hydroxy (Vit-D Deficiency, Fractures)  Visit for screening mammogram - Plan: MM DIGITAL SCREENING BILATERAL  Encouraged healthy diet and regular activity. Daily vitamin D and calcium supplementation. Consider colonoscopy after her 50th birthday this fall, otherwise we'll discuss at her physical in 1 year. Return in about 1 year (around 03/16/2017) for physical.

## 2016-03-17 NOTE — Telephone Encounter (Signed)
Done. Given to Uriah to forward to patient.

## 2016-03-21 LAB — PAP IG AND HPV HIGH-RISK
HPV, high-risk: NEGATIVE
PAP SMEAR COMMENT: 0

## 2016-03-31 DIAGNOSIS — Z13220 Encounter for screening for lipoid disorders: Secondary | ICD-10-CM | POA: Diagnosis not present

## 2016-03-31 DIAGNOSIS — R5383 Other fatigue: Secondary | ICD-10-CM | POA: Diagnosis not present

## 2016-03-31 DIAGNOSIS — Z79899 Other long term (current) drug therapy: Secondary | ICD-10-CM | POA: Diagnosis not present

## 2016-04-01 LAB — CBC WITH DIFFERENTIAL/PLATELET
Basophils Absolute: 0 10*3/uL (ref 0.0–0.2)
Basos: 0 %
EOS (ABSOLUTE): 0.3 10*3/uL (ref 0.0–0.4)
EOS: 4 %
HEMOGLOBIN: 12.9 g/dL (ref 11.1–15.9)
Hematocrit: 38.4 % (ref 34.0–46.6)
IMMATURE GRANULOCYTES: 0 %
Immature Grans (Abs): 0 10*3/uL (ref 0.0–0.1)
Lymphocytes Absolute: 1.6 10*3/uL (ref 0.7–3.1)
Lymphs: 24 %
MCH: 29.7 pg (ref 26.6–33.0)
MCHC: 33.6 g/dL (ref 31.5–35.7)
MCV: 89 fL (ref 79–97)
MONOCYTES: 4 %
Monocytes Absolute: 0.3 10*3/uL (ref 0.1–0.9)
Neutrophils Absolute: 4.6 10*3/uL (ref 1.4–7.0)
Neutrophils: 68 %
Platelets: 204 10*3/uL (ref 150–379)
RBC: 4.34 x10E6/uL (ref 3.77–5.28)
RDW: 14.9 % (ref 12.3–15.4)
WBC: 6.8 10*3/uL (ref 3.4–10.8)

## 2016-04-01 LAB — HEPATIC FUNCTION PANEL
ALT: 12 IU/L (ref 0–32)
AST: 14 IU/L (ref 0–40)
Albumin: 4.3 g/dL (ref 3.5–5.5)
Alkaline Phosphatase: 80 IU/L (ref 39–117)
Bilirubin Total: 0.2 mg/dL (ref 0.0–1.2)
Bilirubin, Direct: 0.1 mg/dL (ref 0.00–0.40)
TOTAL PROTEIN: 7 g/dL (ref 6.0–8.5)

## 2016-04-01 LAB — BASIC METABOLIC PANEL
BUN / CREAT RATIO: 22 (ref 9–23)
BUN: 16 mg/dL (ref 6–24)
CHLORIDE: 102 mmol/L (ref 96–106)
CO2: 22 mmol/L (ref 18–29)
Calcium: 9.4 mg/dL (ref 8.7–10.2)
Creatinine, Ser: 0.72 mg/dL (ref 0.57–1.00)
GFR calc Af Amer: 114 mL/min/{1.73_m2} (ref >59)
GFR calc non Af Amer: 99 mL/min/{1.73_m2} (ref >59)
Glucose: 98 mg/dL (ref 65–99)
POTASSIUM: 4.7 mmol/L (ref 3.5–5.2)
Sodium: 141 mmol/L (ref 134–144)

## 2016-04-01 LAB — LIPID PANEL
CHOL/HDL RATIO: 3 ratio (ref 0.0–4.4)
Cholesterol, Total: 146 mg/dL (ref 100–199)
HDL: 48 mg/dL (ref >39)
LDL Calculated: 81 mg/dL (ref 0–99)
Triglycerides: 87 mg/dL (ref 0–149)
VLDL Cholesterol Cal: 17 mg/dL (ref 5–40)

## 2016-04-01 LAB — VITAMIN D 25 HYDROXY (VIT D DEFICIENCY, FRACTURES): Vit D, 25-Hydroxy: 33 ng/mL (ref 30.0–100.0)

## 2016-04-01 LAB — TSH: TSH: 1.44 u[IU]/mL (ref 0.450–4.500)

## 2016-04-03 ENCOUNTER — Encounter (HOSPITAL_COMMUNITY): Payer: Self-pay

## 2016-04-03 ENCOUNTER — Ambulatory Visit (HOSPITAL_COMMUNITY)
Admission: RE | Admit: 2016-04-03 | Discharge: 2016-04-03 | Disposition: A | Discharge status: Home / Self Care | Payer: 59 | Source: Ambulatory Visit | Attending: Family Medicine | Admitting: Family Medicine

## 2016-04-03 ENCOUNTER — Other Ambulatory Visit: Payer: Self-pay | Admitting: Family Medicine

## 2016-04-03 ENCOUNTER — Ambulatory Visit (HOSPITAL_COMMUNITY): Admission: RE | Admit: 2016-04-03 | Payer: 59 | Source: Ambulatory Visit

## 2016-04-03 DIAGNOSIS — Z1231 Encounter for screening mammogram for malignant neoplasm of breast: Secondary | ICD-10-CM | POA: Insufficient documentation

## 2016-04-03 DIAGNOSIS — R928 Other abnormal and inconclusive findings on diagnostic imaging of breast: Secondary | ICD-10-CM | POA: Insufficient documentation

## 2016-04-07 ENCOUNTER — Other Ambulatory Visit: Payer: Self-pay | Admitting: Family Medicine

## 2016-04-07 DIAGNOSIS — R928 Other abnormal and inconclusive findings on diagnostic imaging of breast: Secondary | ICD-10-CM

## 2016-04-07 MED FILL — predniSONE 20 MG TABS: 20 | 17 days supply | Qty: 25 | Fill #0

## 2016-04-13 DIAGNOSIS — X32XXXA Exposure to sunlight, initial encounter: Secondary | ICD-10-CM | POA: Diagnosis not present

## 2016-04-13 DIAGNOSIS — L570 Actinic keratosis: Secondary | ICD-10-CM | POA: Diagnosis not present

## 2016-04-13 DIAGNOSIS — Z1283 Encounter for screening for malignant neoplasm of skin: Secondary | ICD-10-CM | POA: Diagnosis not present

## 2016-04-13 DIAGNOSIS — L301 Dyshidrosis [pompholyx]: Secondary | ICD-10-CM | POA: Diagnosis not present

## 2016-04-18 ENCOUNTER — Ambulatory Visit (HOSPITAL_COMMUNITY)
Admission: RE | Admit: 2016-04-18 | Discharge: 2016-04-18 | Disposition: A | Discharge status: Home / Self Care | Payer: 59 | Source: Ambulatory Visit | Attending: Family Medicine | Admitting: Family Medicine

## 2016-04-18 ENCOUNTER — Encounter (HOSPITAL_COMMUNITY): Payer: 59

## 2016-04-18 ENCOUNTER — Other Ambulatory Visit: Payer: Self-pay | Admitting: Family Medicine

## 2016-04-18 DIAGNOSIS — R928 Other abnormal and inconclusive findings on diagnostic imaging of breast: Secondary | ICD-10-CM

## 2016-04-18 DIAGNOSIS — N6012 Diffuse cystic mastopathy of left breast: Secondary | ICD-10-CM | POA: Diagnosis not present

## 2016-04-18 DIAGNOSIS — N6489 Other specified disorders of breast: Secondary | ICD-10-CM | POA: Diagnosis not present

## 2016-04-18 DIAGNOSIS — N6320 Unspecified lump in the left breast, unspecified quadrant: Secondary | ICD-10-CM | POA: Diagnosis not present

## 2016-04-25 ENCOUNTER — Other Ambulatory Visit: Payer: Self-pay | Admitting: Family Medicine

## 2016-04-25 ENCOUNTER — Ambulatory Visit (HOSPITAL_COMMUNITY)
Admission: RE | Admit: 2016-04-25 | Discharge: 2016-04-25 | Disposition: A | Discharge status: Home / Self Care | Payer: 59 | Source: Ambulatory Visit | Attending: Family Medicine | Admitting: Family Medicine

## 2016-04-25 DIAGNOSIS — N6320 Unspecified lump in the left breast, unspecified quadrant: Secondary | ICD-10-CM

## 2016-04-25 DIAGNOSIS — N6012 Diffuse cystic mastopathy of left breast: Secondary | ICD-10-CM | POA: Diagnosis not present

## 2016-04-25 DIAGNOSIS — R928 Other abnormal and inconclusive findings on diagnostic imaging of breast: Secondary | ICD-10-CM | POA: Insufficient documentation

## 2016-04-25 DIAGNOSIS — N6321 Unspecified lump in the left breast, upper outer quadrant: Secondary | ICD-10-CM | POA: Diagnosis not present

## 2016-04-25 MED ORDER — LIDOCAINE HCL (PF) 1 % IJ SOLN
INTRAMUSCULAR | Status: AC
  Filled 2016-04-25: qty 10

## 2016-04-27 ENCOUNTER — Telehealth: Payer: Self-pay | Admitting: Family Medicine

## 2016-04-27 NOTE — Telephone Encounter (Signed)
See surgical pathology report in red folder in office.

## 2016-05-26 ENCOUNTER — Other Ambulatory Visit: Payer: Self-pay | Admitting: Family Medicine

## 2016-09-21 ENCOUNTER — Other Ambulatory Visit: Payer: Self-pay | Admitting: Family Medicine

## 2016-12-25 ENCOUNTER — Encounter: Payer: Self-pay | Admitting: Family Medicine

## 2016-12-25 ENCOUNTER — Ambulatory Visit (INDEPENDENT_AMBULATORY_CARE_PROVIDER_SITE_OTHER): Payer: 59 | Admitting: Family Medicine

## 2016-12-25 VITALS — BP 128/78 | Temp 98.2°F | Ht 66.5 in | Wt 193.0 lb

## 2016-12-25 DIAGNOSIS — R21 Rash and other nonspecific skin eruption: Secondary | ICD-10-CM

## 2016-12-25 DIAGNOSIS — I889 Nonspecific lymphadenitis, unspecified: Secondary | ICD-10-CM | POA: Diagnosis not present

## 2016-12-25 MED ORDER — DOXYCYCLINE HYCLATE 100 MG PO TABS: 100.0000 mg | ORAL_TABLET | Freq: Two times a day (BID) | ORAL | 0 refills | Status: DC

## 2016-12-25 MED ORDER — VALACYCLOVIR HCL 1 G PO TABS: 1000.0000 mg | ORAL_TABLET | Freq: Every day | ORAL | 3 refills | Status: DC

## 2016-12-25 NOTE — Progress Notes (Signed)
   Subjective:    Patient ID: Danielle Duke, female    DOB: 05/30/1966, 51 y.o.   MRN: [id number]  HPIInsect bite on left buttock. Itchy, redness around area. Happened 4 days ago.  Itching and irritated and painful  No fever or cholls  Dim enrgy  Feel a bit congested  More dranage and headache  Throat sore at ties    Headache and sore throat. Started 4 days ago  Needs refill on valtrex.   Review of Systems No headache, no major weight loss or weight gain, no chest pain no back pain abdominal pain no change in bowel habits complete ROS otherwise negative     Objective:   Physical Exam Alert active good hydration lungs clear heart regular in rhythm pharynx erythematous and tender anterior nodes right posterior buttock erythematous patch tender nature       Assessment & Plan:  Impression rhinitis/pharyngitis with cervical lymphadenitis and secondarily infected bug bite plan docs he 100 twice a day. Symptom care discussed encouraged to stop smoking

## 2017-03-07 MED FILL — valACYclovir HCL 1 GM TABS: 1 | 90 days supply | Qty: 90 | Fill #0

## 2017-05-03 DIAGNOSIS — H524 Presbyopia: Secondary | ICD-10-CM | POA: Diagnosis not present

## 2017-05-03 DIAGNOSIS — H5213 Myopia, bilateral: Secondary | ICD-10-CM | POA: Diagnosis not present

## 2017-05-03 DIAGNOSIS — H52223 Regular astigmatism, bilateral: Secondary | ICD-10-CM | POA: Diagnosis not present

## 2017-06-18 MED FILL — valACYclovir HCL 1 GM TABS: 1 | 90 days supply | Qty: 90 | Fill #1

## 2017-08-29 ENCOUNTER — Encounter: Payer: Self-pay | Admitting: Family Medicine

## 2017-08-29 ENCOUNTER — Ambulatory Visit (INDEPENDENT_AMBULATORY_CARE_PROVIDER_SITE_OTHER): Payer: No Typology Code available for payment source | Admitting: Nurse Practitioner

## 2017-08-29 ENCOUNTER — Encounter: Payer: Self-pay | Admitting: Nurse Practitioner

## 2017-08-29 VITALS — BP 124/80 | Temp 98.7°F | Ht 66.5 in | Wt 192.0 lb

## 2017-08-29 DIAGNOSIS — J0110 Acute frontal sinusitis, unspecified: Secondary | ICD-10-CM

## 2017-08-29 MED ORDER — AZITHROMYCIN 250 MG PO TABS: ORAL_TABLET | ORAL | 0 refills | Status: DC

## 2017-08-29 MED ORDER — HYDROCODONE-HOMATROPINE 5-1.5 MG/5ML PO SYRP: 5.0000 mL | ORAL_SOLUTION | ORAL | 0 refills | Status: DC | PRN

## 2017-08-31 ENCOUNTER — Encounter: Payer: Self-pay | Admitting: Nurse Practitioner

## 2017-08-31 NOTE — Progress Notes (Signed)
Subjective:  Presents for c/o congestion, cough x 1 week. No fever. Facial area headache. Occasional non productive cough. Worse at night. Bilateral ear pain. No wheezing. Nausea, no vomiting. No diarrhea or abdominal pain.   Objective:   BP 124/80   Temp 98.7 F (37.1 C) (Oral)   Ht 5' 6.5" (1.689 m)   Wt 192 lb 0.4 oz (87.1 kg)   BMI 30.53 kg/m  NAD. Alert, oriented. TMs clear effusion. Pharynx injected with PND noted. Neck supple with mild anterior adenopathy. Lungs clear. Heart RRR.   Assessment:  Acute non-recurrent frontal sinusitis    Plan:   Meds ordered this encounter  Medications  . azithromycin (ZITHROMAX Z-PAK) 250 MG tablet    Sig: Take 2 tablets (500 mg) on  Day 1,  followed by 1 tablet (250 mg) once daily on Days 2 through 5.    Dispense:  6 each    Refill:  0    Order Specific Question:   Supervising Provider    Answer:   Mikey Kirschner [[id number]]  . HYDROcodone-homatropine (HYCODAN) 5-1.5 MG/5ML syrup    Sig: Take 5 mLs by mouth every 4 (four) hours as needed.    Dispense:  90 mL    Refill:  0    Order Specific Question:   Supervising Provider    Answer:   Mikey Kirschner [[id number]]   OTC meds as directed. Call back if symptoms worsen or persist.

## 2017-10-01 MED FILL — valACYclovir HCL 1 GM TABS: 1 | 90 days supply | Qty: 90 | Fill #2

## 2017-12-18 MED FILL — valACYclovir HCL 1 GM TABS: 1 | 90 days supply | Qty: 90 | Fill #3

## 2018-01-08 ENCOUNTER — Ambulatory Visit (INDEPENDENT_AMBULATORY_CARE_PROVIDER_SITE_OTHER): Payer: No Typology Code available for payment source | Admitting: Family Medicine

## 2018-01-08 ENCOUNTER — Telehealth: Payer: Self-pay | Admitting: Family Medicine

## 2018-01-08 ENCOUNTER — Encounter: Payer: Self-pay | Admitting: Family Medicine

## 2018-01-08 VITALS — BP 134/84 | Ht 67.0 in | Wt 190.4 lb

## 2018-01-08 DIAGNOSIS — Z79899 Other long term (current) drug therapy: Secondary | ICD-10-CM | POA: Diagnosis not present

## 2018-01-08 DIAGNOSIS — Z1329 Encounter for screening for other suspected endocrine disorder: Secondary | ICD-10-CM

## 2018-01-08 DIAGNOSIS — Z124 Encounter for screening for malignant neoplasm of cervix: Secondary | ICD-10-CM | POA: Diagnosis not present

## 2018-01-08 DIAGNOSIS — Z0001 Encounter for general adult medical examination with abnormal findings: Secondary | ICD-10-CM

## 2018-01-08 DIAGNOSIS — Z13220 Encounter for screening for lipoid disorders: Secondary | ICD-10-CM | POA: Diagnosis not present

## 2018-01-08 DIAGNOSIS — J0110 Acute frontal sinusitis, unspecified: Secondary | ICD-10-CM

## 2018-01-08 DIAGNOSIS — Z1211 Encounter for screening for malignant neoplasm of colon: Secondary | ICD-10-CM | POA: Diagnosis not present

## 2018-01-08 DIAGNOSIS — R300 Dysuria: Secondary | ICD-10-CM

## 2018-01-08 DIAGNOSIS — Z0000 Encounter for general adult medical examination without abnormal findings: Secondary | ICD-10-CM

## 2018-01-08 LAB — POCT URINALYSIS DIPSTICK: PH UA: 6 (ref 5.0–8.0)

## 2018-01-08 MED ORDER — BUPROPION HCL ER (SR) 150 MG PO TB12: ORAL_TABLET | ORAL | 2 refills | Status: DC

## 2018-01-08 MED FILL — BUPROPION SR 150 MG TABLET: 150 | 30 days supply | Qty: 60 | Fill #0

## 2018-01-08 NOTE — Telephone Encounter (Signed)
Contacted patient to inform her that urine looked normal under microscope per Dr.Steve

## 2018-01-08 NOTE — Progress Notes (Signed)
   Subjective:    Patient ID: Danielle Duke, female    DOB: 1966-06-09, 52 y.o.   MRN: [id number]  HPI The patient comes in today for a wellness visit.    A review of their health history was completed.  A review of medications was also completed.  Any needed refills; Valtrex  Eating habits: health conscious  Falls/  MVA accidents in past few months: no  Regular exercise: works at hospital  Specialist pt sees on regular basis: no  Preventative health issues were discussed.   Additional concerns: smoking cessation   strting as an rt , has twins at home, born early  needs wellnes visit   Fair amnt of stress these days  Smokes around  One half pack per day, quit in the past for a whole month   self grades diet better, has cut down sweets and fats     Trying to stay active but not exercising   Urine has smell to ti   Fair amnt of walking at work ,   As not used an y meds or knicotine product sfor quitting                                       Review of Systems  Constitutional: Negative for activity change, appetite change and fatigue.  HENT: Negative for congestion and rhinorrhea.   Eyes: Negative for discharge.  Respiratory: Negative for cough, chest tightness and wheezing.   Cardiovascular: Negative for chest pain.  Gastrointestinal: Negative for abdominal pain, blood in stool and vomiting.  Endocrine: Negative for polyphagia.  Genitourinary: Negative for difficulty urinating and frequency.  Musculoskeletal: Negative for neck pain.  Skin: Negative for color change.  Allergic/Immunologic: Negative for environmental allergies and food allergies.  Neurological: Negative for weakness and headaches.  Psychiatric/Behavioral: Negative for agitation and behavioral problems.  All other systems reviewed and are negative.      Objective:   Physical Exam  Constitutional: She is oriented to person, place, and time. She appears  well-developed and well-nourished.  HENT:  Head: Normocephalic.  Right Ear: External ear normal.  Left Ear: External ear normal.  Eyes: Pupils are equal, round, and reactive to light.  Neck: Normal range of motion. No thyromegaly present.  Cardiovascular: Normal rate, regular rhythm, normal heart sounds and intact distal pulses.  No murmur heard. Pulmonary/Chest: Effort normal and breath sounds normal. No respiratory distress. She has no wheezes.  Abdominal: Soft. Bowel sounds are normal. She exhibits no distension and no mass. There is no tenderness.  Genitourinary: Vagina normal and uterus normal.  Genitourinary Comments: Breast exam within normal limits bilateral.  Pap smear obtained  Musculoskeletal: Normal range of motion. She exhibits no edema or tenderness.  Lymphadenopathy:    She has no cervical adenopathy.  Neurological: She is alert and oriented to person, place, and time. She exhibits normal muscle tone.  Skin: Skin is warm and dry.  Psychiatric: She has a normal mood and affect. Her behavior is normal.          Assessment & Plan:  Impression wellness visit.  Do a colonoscopy we will help schedule.  Time for blood work.  Last mammogram 1/2 years ago encouraged to go ahead and sign out.  Pap smear obtained and sent.  Diet discussed.  Exercise discussed.  Smoking cessation options discussed Wellbutrin prescribed

## 2018-01-11 LAB — PAP IG W/ RFLX HPV ASCU: PAP Smear Comment: 0

## 2018-01-11 LAB — SPECIMEN STATUS REPORT

## 2018-01-16 ENCOUNTER — Encounter (INDEPENDENT_AMBULATORY_CARE_PROVIDER_SITE_OTHER): Payer: Self-pay | Admitting: *Deleted

## 2018-01-16 ENCOUNTER — Encounter: Payer: Self-pay | Admitting: Family Medicine

## 2018-01-16 ENCOUNTER — Encounter (INDEPENDENT_AMBULATORY_CARE_PROVIDER_SITE_OTHER): Payer: Self-pay

## 2018-02-13 MED FILL — BUPROPION SR 150 MG TABLET: 150 | 30 days supply | Qty: 60 | Fill #1

## 2018-03-29 ENCOUNTER — Other Ambulatory Visit: Payer: Self-pay | Admitting: Family Medicine

## 2018-03-29 MED FILL — valACYclovir HCL 1 GM TABS: 1 | 90 days supply | Qty: 90 | Fill #0

## 2018-03-29 MED FILL — BUPROPION SR 150 MG TABLET: 150 | 30 days supply | Qty: 60 | Fill #2

## 2018-04-03 MED FILL — AMOX-CLAV 875-125 MG TABLET: 875-125 | 10 days supply | Qty: 20 | Fill #0

## 2018-04-03 MED FILL — VENTOLIN HFA 90 MCG INHALER: 108 (90 BAS | 50 days supply | Qty: 18 | Fill #0

## 2018-04-03 MED FILL — METHYLPREDNISOLONE 4 MG TAB: 4 | 6 days supply | Qty: 21 | Fill #0

## 2018-05-23 ENCOUNTER — Ambulatory Visit (HOSPITAL_COMMUNITY)
Admission: EM | Admit: 2018-05-23 | Discharge: 2018-05-23 | Disposition: A | Discharge status: Home / Self Care | Payer: No Typology Code available for payment source | Attending: Internal Medicine | Admitting: Internal Medicine

## 2018-05-23 ENCOUNTER — Telehealth: Payer: Self-pay

## 2018-05-23 ENCOUNTER — Encounter (HOSPITAL_COMMUNITY): Payer: Self-pay | Admitting: Emergency Medicine

## 2018-05-23 ENCOUNTER — Telehealth: Payer: Self-pay | Admitting: Family Medicine

## 2018-05-23 DIAGNOSIS — R69 Illness, unspecified: Secondary | ICD-10-CM | POA: Diagnosis not present

## 2018-05-23 DIAGNOSIS — R509 Fever, unspecified: Secondary | ICD-10-CM

## 2018-05-23 DIAGNOSIS — J111 Influenza due to unidentified influenza virus with other respiratory manifestations: Secondary | ICD-10-CM

## 2018-05-23 DIAGNOSIS — R05 Cough: Secondary | ICD-10-CM

## 2018-05-23 DIAGNOSIS — R059 Cough, unspecified: Secondary | ICD-10-CM

## 2018-05-23 DIAGNOSIS — R110 Nausea: Secondary | ICD-10-CM

## 2018-05-23 DIAGNOSIS — R0981 Nasal congestion: Secondary | ICD-10-CM

## 2018-05-23 DIAGNOSIS — H9209 Otalgia, unspecified ear: Secondary | ICD-10-CM

## 2018-05-23 MED ORDER — IBUPROFEN 800 MG PO TABS: 800.0000 mg | ORAL_TABLET | Freq: Three times a day (TID) | ORAL | 0 refills | Status: DC

## 2018-05-23 MED ORDER — ONDANSETRON 4 MG PO TBDP: 4.0000 mg | ORAL_TABLET | Freq: Three times a day (TID) | ORAL | 0 refills | Status: DC | PRN

## 2018-05-23 MED FILL — IBUPROFEN 800 MG TAB: 800 | 7 days supply | Qty: 21 | Fill #0

## 2018-05-23 MED FILL — ONDANSETRON ODT 4 MG TABLET: 4 | 4 days supply | Qty: 12 | Fill #0

## 2018-05-23 NOTE — ED Triage Notes (Signed)
Pt c/o fever, chills, abdominal cramping, body aches, since yesterday.

## 2018-05-23 NOTE — Discharge Instructions (Signed)
Push fluids to ensure adequate hydration and keep secretions thin.  Tylenol and/or ibuprofen as needed for pain or fevers.  Zofran as needed for nausea.  Rest.  If symptoms worsen or do not improve in the next week to return to be seen or to follow up with your PCP.

## 2018-05-23 NOTE — ED Provider Notes (Signed)
[facility]    CSN: [id number] Arrival date & time: 05/23/18  1232     History   Chief Complaint Chief Complaint  Patient presents with  . Fever  . Generalized Body Aches    HPI Danielle Duke is a 52 y.o. female.   [name] presents with complaints of body aches, fevers, chills, earache, abdominal cramping and nausea, occasional cough, which started yesterday. tmax of 101.3 ibuprofen has been helpful, taken last prior to arrival.  No sore throat, no nasal drainage. No vomiting or diarrhea. Normal urination. No skin rash. Did get a flu vaccine. States she has been sleeping in the ICU recently as her husband is being treated there. No other specific ill contacts. Without contributing medical history.      ROS per HPI.      History reviewed. No pertinent past medical history.  Patient Active Problem List   Diagnosis Date Noted  . Wrist pain 12/30/2013  . Herpes simplex type 2 infection 05/22/2013    History reviewed. No pertinent surgical history.  OB History   None      Home Medications    Prior to Admission medications   Medication Sig Start Date End Date Taking? Authorizing Provider  buPROPion [facility] SR) 150 MG 12 hr tablet Take one each morning for 3 days then take take two daily 01/08/18   Mikey Kirschner, MD  ibuprofen (ADVIL,MOTRIN) 800 MG tablet Take 1 tablet (800 mg total) by mouth 3 (three) times daily. 05/23/18   Zigmund Gottron, NP  ondansetron (ZOFRAN-ODT) 4 MG disintegrating tablet Take 1 tablet (4 mg total) by mouth every 8 (eight) hours as needed for nausea or vomiting. 05/23/18   Zigmund Gottron, NP  valACYclovir (VALTREX) 1000 MG tablet TAKE 1 TABLET BY MOUTH ONCE DAILY 03/29/18   Mikey Kirschner, MD    Family History Family History  Problem Relation Age of Onset  . Diabetes Father   . Hypertension Father     Social History Social History   Tobacco Use  . Smoking status: Current Every Day Smoker    Packs/day: 0.60      Types: Cigarettes  . Smokeless tobacco: Never Used  Substance Use Topics  . Alcohol use: Yes    Comment: rare social  . Drug use: No     Allergies   Patient has no known allergies.   Review of Systems Review of Systems   Physical Exam Triage Vital Signs ED Triage Vitals  Enc Vitals Group     BP 05/23/18 1339 104/68     Pulse Rate 05/23/18 1339 98     Resp 05/23/18 1339 16     Temp 05/23/18 1339 98.7 F (37.1 C)     Temp src --      SpO2 05/23/18 1339 96 %     Weight --      Height --      Head Circumference --      Peak Flow --      Pain Score 05/23/18 1340 6     Pain Loc --      Pain Edu? --      Excl. in [place]? --    No data found.  Updated Vital Signs BP 104/68   Pulse 98   Temp 98.7 F (37.1 C)   Resp 16   SpO2 96%    Physical Exam  Constitutional: She is oriented to person, place, and time. She appears well-developed and well-nourished. She appears  ill. No distress.  HENT:  Head: Normocephalic and atraumatic.  Right Ear: Tympanic membrane, external ear and ear canal normal.  Left Ear: Tympanic membrane, external ear and ear canal normal.  Nose: Nose normal.  Mouth/Throat: Uvula is midline, oropharynx is clear and moist and mucous membranes are normal. No tonsillar exudate.  Eyes: Pupils are equal, round, and reactive to light. Conjunctivae and EOM are normal.  Cardiovascular: Normal rate, regular rhythm and normal heart sounds.  Pulmonary/Chest: Effort normal and breath sounds normal.  Abdominal: Soft. There is no tenderness. There is no rigidity, no rebound, no guarding, no CVA tenderness, no tenderness at McBurney's point and negative Murphy's sign.  Neurological: She is alert and oriented to person, place, and time.  Skin: Skin is warm and dry.     UC Treatments / Results  Labs (all labs ordered are listed, but only abnormal results are displayed) Labs Reviewed - No data to display  EKG None  Radiology No results  found.  Procedures Procedures (including critical care time)  Medications Ordered in UC Medications - No data to display  Initial Impression / Assessment and Plan / UC Course  I have reviewed the triage vital signs and the nursing notes.  Pertinent labs & imaging results that were available during my care of the patient were reviewed by me and considered in my medical decision making (see chart for details).     Patient ill appearing but with non specific history and benign physical exam. Taking po. Ibuprofen helping with symptoms. Flu like. Continue with supportive cares. Return precautions provided. If symptoms worsen or do not improve in the next week to return to be seen or to follow up with PCP.  Patient verbalized understanding and agreeable to plan.   Final Clinical Impressions(s) / UC Diagnoses   Final diagnoses:  Influenza-like illness     Discharge Instructions     Push fluids to ensure adequate hydration and keep secretions thin.  Tylenol and/or ibuprofen as needed for pain or fevers.  Zofran as needed for nausea.  Rest.  If symptoms worsen or do not improve in the next week to return to be seen or to follow up with your PCP.     ED Prescriptions    Medication Sig Dispense Auth. Provider   ondansetron (ZOFRAN-ODT) 4 MG disintegrating tablet Take 1 tablet (4 mg total) by mouth every 8 (eight) hours as needed for nausea or vomiting. 12 tablet Augusto Gamble B, NP   ibuprofen (ADVIL,MOTRIN) 800 MG tablet Take 1 tablet (800 mg total) by mouth 3 (three) times daily. 21 tablet Zigmund Gottron, NP     Controlled Substance Prescriptions [place] Controlled Substance Registry consulted? Not Applicable   Zigmund Gottron, NP 05/23/18 1419

## 2018-05-23 NOTE — Progress Notes (Signed)
Based on what you shared with me it looks like you have a condition that should be evaluated in a face to face office visit.  NOTE: If you entered your credit card information for this eVisit, you will not be charged. You may see a "hold" on your card for the $30 but that hold will drop off and you will not have a charge processed.  If you are having a true medical emergency please call 911.  If you need an urgent face to face visit, [facility] has four urgent care centers for your convenience.  If you need care fast and have a high deductible or no insurance consider:   [email or web address] to reserve your spot online an avoid wait times  [facility] [address], [address] [place], [place] [address] 8 am to 8 pm Monday-Friday 10 am to 4 pm Saturday-Sunday *Across the street from [employer]  [facility]  [address] [place], [address] 8 am to 5 pm Monday-Friday * In the [facility]   The following sites will take your  insurance:  . [facility]  [phone] Get Driving Directions Find a Provider at this Location  [address] [place], [place] [address] . 10 am to 8 pm Monday-Friday . 12 pm to 8 pm Saturday-Sunday   . [facility]  [phone] Get Driving Directions Find a Provider at this Location  [address], [address] [place], [place] [address] . 8 am to 8 pm Monday-Friday . 9 am to 6 pm Saturday . 11 am to 6 pm Sunday   . [facility] [place]  [phone] Get Driving Directions  [address] [place], [place] [address] . 8 am to 8 pm Monday-Friday . 8 am to 4 pm Saturday-Sunday   Your e-visit answers were reviewed by a board certified advanced clinical practitioner to complete your personal care plan.  Thank you for using e-Visits. 

## 2018-05-23 NOTE — Telephone Encounter (Signed)
Patient called stating she is sick with nausea,fevefr,chills,ear pain and wanted to be seen today. I advised we had no available appt for today recommend that she go to urgent care or ed. Pt states understanding.

## 2018-05-27 ENCOUNTER — Ambulatory Visit (INDEPENDENT_AMBULATORY_CARE_PROVIDER_SITE_OTHER): Payer: No Typology Code available for payment source | Admitting: Family Medicine

## 2018-05-27 ENCOUNTER — Encounter: Payer: Self-pay | Admitting: Family Medicine

## 2018-05-27 VITALS — BP 124/70 | Temp 98.7°F | Ht 67.0 in | Wt 181.8 lb

## 2018-05-27 DIAGNOSIS — B349 Viral infection, unspecified: Secondary | ICD-10-CM

## 2018-05-27 MED ORDER — ONDANSETRON 4 MG PO TBDP: 4.0000 mg | ORAL_TABLET | Freq: Three times a day (TID) | ORAL | 0 refills | Status: DC | PRN

## 2018-05-27 NOTE — Progress Notes (Signed)
   Subjective:    Patient ID: Danielle Duke, female    DOB: [birth year], 52 y.o.   MRN: [id number]  Fever   This is a new problem. Episode onset: nov 6th. The maximum temperature noted was 103 to 103.9 F. Associated symptoms include abdominal pain, coughing, diarrhea, ear pain, headaches and a sore throat. Associated symptoms comments: Dizziness, weakness, not eating. Treatments tried: zofran, ibuprofen, tylenol,   Seen last week at urgent care on 05/23/18 and diagnosed with flu-like illness.  Prescribed zofran, and ibuprofen 800mg . Reports fever q4-5 hours with stomach cramps. Has been taking ibuprofen and tylenol intermittently. Last dose of tylenol this morning at 07:00. Temp this morning at home was 103.2. Reports continued nausea, no vomiting; zofran helpful. Diarrhea x 1 episode this morning, no blood in stool. Reports frontal h/a occurs with fever. Reports dry cough, non productive.   Symptoms have stayed the same, no improvement. Scheduled to go back to work Wednesday night.   Trying to stay hydrated, no appetite not eating well. Urinating okay but reports urine is dark in color.    Review of Systems  Constitutional: Positive for fever.  HENT: Positive for ear pain and sore throat.   Respiratory: Positive for cough.   Gastrointestinal: Positive for abdominal pain and diarrhea.  Neurological: Positive for headaches.       Objective:   Physical Exam  Constitutional: She is oriented to person, place, and time. She appears well-developed and well-nourished. No distress.  HENT:  Head: Normocephalic and atraumatic.  Right Ear: Tympanic membrane normal.  Left Ear: Tympanic membrane normal.  Nose: Nose normal.  Mouth/Throat: Oropharynx is clear and moist.  Eyes: Right eye exhibits no discharge. Left eye exhibits no discharge.  Neck: Neck supple.  Cardiovascular: Normal rate, regular rhythm and normal heart sounds.  No murmur heard. Pulmonary/Chest: Effort normal and breath sounds  normal. No respiratory distress. She has no wheezes.  Abdominal: Soft. Bowel sounds are normal. She exhibits no distension and no mass. There is no tenderness.  Lymphadenopathy:    She has no cervical adenopathy (tender).  Neurological: She is alert and oriented to person, place, and time.  Skin: Skin is warm and dry.  Psychiatric: She has a normal mood and affect.  Nursing note and vitals reviewed.      Assessment & Plan:  Acute viral syndrome  Likely still viral process at this time.  Recommend symptomatic care.  May continue Zofran as needed for nausea.  Continue Tylenol and ibuprofen as needed for fever.  Recommend increasing fluid intake of clear liquids, recommended trying broth, and bland diet as tolerated.  If symptoms worsen over the next several days she is to follow-up.  Warning signs discussed.  Dr. Mickie Hillier was consulted on this case, he also examined the patient, and is in agreement with the above treatment plan.  As attending physician to this patient visit, this patient was seen in conjunction with the nurse practitioner.  The history,physical and treatment plan was reviewed with the nurse practitioner and pertinent findings were verified with the patient.  Also the treatment plan was reviewed with the patient while they were present. WSLMD

## 2018-05-29 ENCOUNTER — Ambulatory Visit (INDEPENDENT_AMBULATORY_CARE_PROVIDER_SITE_OTHER): Payer: No Typology Code available for payment source | Admitting: Family Medicine

## 2018-05-29 ENCOUNTER — Encounter: Payer: Self-pay | Admitting: Family Medicine

## 2018-05-29 VITALS — Temp 98.9°F | Ht 67.0 in | Wt 185.4 lb

## 2018-05-29 DIAGNOSIS — J329 Chronic sinusitis, unspecified: Secondary | ICD-10-CM | POA: Diagnosis not present

## 2018-05-29 DIAGNOSIS — R05 Cough: Secondary | ICD-10-CM

## 2018-05-29 DIAGNOSIS — R059 Cough, unspecified: Secondary | ICD-10-CM

## 2018-05-29 DIAGNOSIS — B349 Viral infection, unspecified: Secondary | ICD-10-CM | POA: Diagnosis not present

## 2018-05-29 MED ORDER — AMOXICILLIN-POT CLAVULANATE 875-125 MG PO TABS: ORAL_TABLET | ORAL | 0 refills | Status: DC

## 2018-05-29 NOTE — Progress Notes (Signed)
   Subjective:    Patient ID: Danielle Duke, female    DOB: 1966-04-04, 52 y.o.   MRN: [id number]  HPI Pt here today for follow up. Pt went to Urgent care on 05/23/18 and was seen here on 05/27/18. Pt is still having fever. Pt states her fever was 99.5 orally at home.   Continue diminished energy.  Continued cough.  Rarely productive.  Still having fever.  Somewhat concerning to her.  Yesterday was better.  But today feeling left again. Review of Systems No headache, no major weight loss or weight gain, no chest pain no back pain abdominal pain no change in bowel habits complete ROS otherwise negative     Objective:   Physical Exam  Alert moderate malaise.  Some congestion.  Pharynx normal neck supple.  Lungs bronchial cough no tachypnea heart regular rate and rhythm.      Assessment & Plan:  Impression probable viral syndrome.  Actually probable parainfluenza.  Discussed.  We will add antibiotic for potential secondary bronchitis and even sinusitis element.  CBC and chest x-ray to be sure nothing more serious developing.  Discussed

## 2018-05-30 ENCOUNTER — Ambulatory Visit (HOSPITAL_COMMUNITY)
Admission: RE | Admit: 2018-05-30 | Discharge: 2018-05-30 | Disposition: A | Discharge status: Home / Self Care | Payer: No Typology Code available for payment source | Source: Ambulatory Visit | Attending: Family Medicine | Admitting: Family Medicine

## 2018-05-30 DIAGNOSIS — R05 Cough: Secondary | ICD-10-CM | POA: Diagnosis not present

## 2018-05-30 DIAGNOSIS — B349 Viral infection, unspecified: Secondary | ICD-10-CM | POA: Diagnosis present

## 2018-05-30 LAB — CBC WITH DIFFERENTIAL/PLATELET
BASOS ABS: 0 10*3/uL (ref 0.0–0.2)
BASOS: 0 %
EOS (ABSOLUTE): 0.1 10*3/uL (ref 0.0–0.4)
EOS: 1 %
HEMATOCRIT: 30.7 % — AB (ref 34.0–46.6)
Hemoglobin: 10.6 g/dL — ABNORMAL LOW (ref 11.1–15.9)
Immature Grans (Abs): 0.1 10*3/uL (ref 0.0–0.1)
Immature Granulocytes: 1 %
LYMPHS: 16 %
Lymphocytes Absolute: 1.4 10*3/uL (ref 0.7–3.1)
MCH: 30.2 pg (ref 26.6–33.0)
MCHC: 34.5 g/dL (ref 31.5–35.7)
MCV: 88 fL (ref 79–97)
MONOCYTES: 7 %
Monocytes Absolute: 0.6 10*3/uL (ref 0.1–0.9)
Neutrophils Absolute: 6.3 10*3/uL (ref 1.4–7.0)
Neutrophils: 75 %
Platelets: 249 10*3/uL (ref 150–450)
RBC: 3.51 x10E6/uL — ABNORMAL LOW (ref 3.77–5.28)
RDW: 13.8 % (ref 12.3–15.4)
WBC: 8.4 10*3/uL (ref 3.4–10.8)

## 2018-08-08 MED FILL — valACYclovir HCL 1 GM TABS: 1 | 90 days supply | Qty: 90 | Fill #1

## 2018-10-04 ENCOUNTER — Encounter: Payer: Self-pay | Admitting: Family Medicine

## 2018-10-04 ENCOUNTER — Telehealth: Payer: Self-pay | Admitting: Family Medicine

## 2018-10-04 MED ORDER — AMOXICILLIN 500 MG PO TABS: ORAL_TABLET | ORAL | 0 refills | Status: DC

## 2018-10-04 NOTE — Telephone Encounter (Signed)
May have a work excuse for the next 3 to 4 days return to work Monday seems reasonable if she needs a longer let us know

## 2018-10-04 NOTE — Telephone Encounter (Signed)
Pt contacted and not given.

## 2018-10-04 NOTE — Telephone Encounter (Signed)
Given symptomatology we can call in medication-more than likely strep although there could be some element of sinus If the patient would rather be seen that we will have to be this afternoon  If the patient prefers medication to be called and I recommend amoxicillin 500 mg take 1, 3 times daily over the course of the next 10 days if ongoing troubles progressive troubles difficulty breathing high fevers etc. seek intervention

## 2018-10-04 NOTE — Telephone Encounter (Signed)
Symptoms started within last day or so. No fever, no sob, hurts to swallow, one side of throat is swollen, has white spots on throat, has been exposed to step, also having headaches. Congestion- green. Throwing up mucus. Congestion for about 2 weeks. Tried claritin and flonase.  Pt was hot the other night but did not check temp. Thought it was a hot flash. 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   [place] ap[facility].

## 2018-10-04 NOTE — Telephone Encounter (Signed)
Sent to the [employer] and [place] at front desk verbally made aware.

## 2018-10-04 NOTE — Telephone Encounter (Signed)
Left message to return call 

## 2018-10-04 NOTE — Telephone Encounter (Signed)
Patient states has sore throat, right-side swollen-white spots,sinus drainage, no fever,no shortness of breath.[employer]

## 2018-10-04 NOTE — Telephone Encounter (Signed)
Patient states she will try the antibiotics first and call back if needed. She also wanted to know if she need to be out of work, If so she will need a Doctors note. Please advise.

## 2018-11-08 MED FILL — valACYclovir HCL 1 GM TABS: 1 | 90 days supply | Qty: 90 | Fill #0

## 2019-02-23 ENCOUNTER — Encounter (HOSPITAL_COMMUNITY): Payer: Self-pay | Admitting: Emergency Medicine

## 2019-02-23 ENCOUNTER — Emergency Department (HOSPITAL_COMMUNITY)
Admission: EM | Admit: 2019-02-23 | Discharge: 2019-02-23 | Disposition: A | Discharge status: Home / Self Care | Payer: No Typology Code available for payment source | Attending: Emergency Medicine | Admitting: Emergency Medicine

## 2019-02-23 ENCOUNTER — Emergency Department (HOSPITAL_COMMUNITY): Payer: No Typology Code available for payment source

## 2019-02-23 ENCOUNTER — Other Ambulatory Visit: Payer: Self-pay

## 2019-02-23 DIAGNOSIS — S81859A Open bite, unspecified lower leg, initial encounter: Secondary | ICD-10-CM

## 2019-02-23 DIAGNOSIS — Y9389 Activity, other specified: Secondary | ICD-10-CM | POA: Diagnosis not present

## 2019-02-23 DIAGNOSIS — Y999 Unspecified external cause status: Secondary | ICD-10-CM | POA: Insufficient documentation

## 2019-02-23 DIAGNOSIS — Z79899 Other long term (current) drug therapy: Secondary | ICD-10-CM | POA: Insufficient documentation

## 2019-02-23 DIAGNOSIS — Y929 Unspecified place or not applicable: Secondary | ICD-10-CM | POA: Insufficient documentation

## 2019-02-23 DIAGNOSIS — W540XXA Bitten by dog, initial encounter: Secondary | ICD-10-CM | POA: Insufficient documentation

## 2019-02-23 DIAGNOSIS — S81852A Open bite, left lower leg, initial encounter: Secondary | ICD-10-CM | POA: Insufficient documentation

## 2019-02-23 DIAGNOSIS — Z87891 Personal history of nicotine dependence: Secondary | ICD-10-CM | POA: Diagnosis not present

## 2019-02-23 DIAGNOSIS — S8992XA Unspecified injury of left lower leg, initial encounter: Secondary | ICD-10-CM | POA: Diagnosis present

## 2019-02-23 DIAGNOSIS — Z23 Encounter for immunization: Secondary | ICD-10-CM | POA: Insufficient documentation

## 2019-02-23 MED ORDER — DOUBLE ANTIBIOTIC 500-10000 UNIT/GM EX OINT
TOPICAL_OINTMENT | Freq: Once | CUTANEOUS | Status: AC
  Administered 2019-02-23: 3 via TOPICAL
  Filled 2019-02-23: qty 3

## 2019-02-23 MED ORDER — LIDOCAINE-EPINEPHRINE-TETRACAINE (LET) SOLUTION
3.0000 mL | Freq: Once | NASAL | Status: AC
  Administered 2019-02-23: 3 mL via TOPICAL
  Filled 2019-02-23: qty 3

## 2019-02-23 MED ORDER — LIDOCAINE HCL 2 % IJ SOLN
10.0000 mL | Freq: Once | INTRAMUSCULAR | Status: AC
  Administered 2019-02-23: 200 mg
  Filled 2019-02-23: qty 10

## 2019-02-23 MED ORDER — HYDROCODONE-ACETAMINOPHEN 5-325 MG PO TABS: 1.0000 | ORAL_TABLET | Freq: Four times a day (QID) | ORAL | 0 refills | Status: DC | PRN

## 2019-02-23 MED ORDER — AMOXICILLIN-POT CLAVULANATE 875-125 MG PO TABS
1.0000 | ORAL_TABLET | Freq: Once | ORAL | Status: AC
  Administered 2019-02-23: 1 via ORAL
  Filled 2019-02-23: qty 1

## 2019-02-23 MED ORDER — LIDOCAINE HCL (PF) 2 % IJ SOLN
INTRAMUSCULAR | Status: AC
  Filled 2019-02-23: qty 20

## 2019-02-23 MED ORDER — TETANUS-DIPHTH-ACELL PERTUSSIS 5-2.5-18.5 LF-MCG/0.5 IM SUSP
0.5000 mL | Freq: Once | INTRAMUSCULAR | Status: AC
  Administered 2019-02-23: 0.5 mL via INTRAMUSCULAR
  Filled 2019-02-23: qty 0.5

## 2019-02-23 MED ORDER — HYDROCODONE-ACETAMINOPHEN 5-325 MG PO TABS
1.0000 | ORAL_TABLET | Freq: Once | ORAL | Status: AC
  Administered 2019-02-23: 1 via ORAL
  Filled 2019-02-23: qty 1

## 2019-02-23 MED ORDER — AMOXICILLIN-POT CLAVULANATE 875-125 MG PO TABS: ORAL_TABLET | ORAL | 0 refills | Status: DC

## 2019-02-23 NOTE — ED Triage Notes (Signed)
Pt was accidentally bit by her lab about 45 min ago. Pt states she was attempting break up her two dogs that were fighting. Pt has laceration to LLE, bleeding controlled. Pt has small abrasions to hands. Per pt, dogs are UTD on immunizations.

## 2019-02-23 NOTE — ED Notes (Signed)
PA in for repairs

## 2019-02-23 NOTE — ED Notes (Signed)
Bitten by grand dog to the lower L leg - daughter's lab pt reports "there must have been a girl"  Three puncture wounds one on top of leg Two on the bottom   Dog is UTD on shots  Call to [phone] to report - [place]

## 2019-02-23 NOTE — ED Notes (Signed)
[facility] x 3 to discuss animal bite

## 2019-02-23 NOTE — ED Provider Notes (Signed)
[facility] EMERGENCY DEPARTMENT Provider Note   CSN: [id number] Arrival date & time: 02/23/19  1659     History   Chief Complaint Chief Complaint  Patient presents with   Animal Bite    HPI Danielle Duke is a 53 y.o. female.     HPI  Patient is a 53 year old female with past medical history of back surgery presenting for animal bite to the left lower extremity.  Patient reports that she was breaking up an altercation between HER-2 animals that are fully vaccinated against rabies to her knowledge.  She reports that 1 of the animals bit her left calf.  She reports some mild decrease sensation around the wound site but no distal decrease in sensation.  She reports that she applied hydrogen peroxide to clean it.  Wound was hemostatic prior to arrival.  Patient denies taking blood thinning medications.  Tetanus shot unknown.  History reviewed. No pertinent past medical history.  Patient Active Problem List   Diagnosis Date Noted   Wrist pain 12/30/2013   Herpes simplex type 2 infection 05/22/2013    Past Surgical History:  Procedure Laterality Date   BACK SURGERY       OB History   No obstetric history on file.      Home Medications    Prior to Admission medications   Medication Sig Start Date End Date Taking? Authorizing Provider  amoxicillin (AMOXIL) 500 MG tablet One three time per day for ten days 10/04/18   Mikey Kirschner, MD  amoxicillin-clavulanate (AUGMENTIN) [phone] MG tablet Take one tablet by mouth twice daily for 10 days 05/29/18   Mikey Kirschner, MD  buPROPion [facility] SR) 150 MG 12 hr tablet Take one each morning for 3 days then take take two daily 01/08/18   Mikey Kirschner, MD  ibuprofen (ADVIL,MOTRIN) 800 MG tablet Take 1 tablet (800 mg total) by mouth 3 (three) times daily. 05/23/18   Zigmund Gottron, NP  ondansetron (ZOFRAN-ODT) 4 MG disintegrating tablet Take 1 tablet (4 mg total) by mouth every 8 (eight) hours as needed for nausea or  vomiting. 05/27/18   Cheyenne Adas, NP  valACYclovir (VALTREX) 1000 MG tablet TAKE 1 TABLET BY MOUTH ONCE DAILY 03/29/18   Mikey Kirschner, MD    Family History Family History  Problem Relation Age of Onset   Diabetes Father    Hypertension Father     Social History Social History   Tobacco Use   Smoking status: Former Smoker    Packs/day: 0.60    Types: Cigarettes   Smokeless tobacco: Never Used  Substance Use Topics   Alcohol use: Yes    Comment: rare social   Drug use: No     Allergies   Acyclovir and related   Review of Systems Review of Systems  Constitutional: Negative for chills and fever.  Skin: Positive for wound. Negative for color change.  Neurological: Negative for weakness and numbness.  Hematological: Does not bruise/bleed easily.     Physical Exam Updated Vital Signs BP (!) 117/53 (BP Location: Right Arm)    Pulse 82    Temp 98.4 F (36.9 C) (Oral)    Resp 16    Ht '5\' 7"'$  (1.702 m)    Wt 83 kg    LMP 08/26/2014    SpO2 96%    BMI 28.66 kg/m   Physical Exam Vitals signs and nursing note reviewed.  Constitutional:      General: She is not in acute  distress.    Appearance: She is well-developed. She is not diaphoretic.     Comments: Sitting comfortably in bed.  HENT:     Head: Normocephalic and atraumatic.  Eyes:     General:        Right eye: No discharge.        Left eye: No discharge.     Conjunctiva/sclera: Conjunctivae normal.     Comments: EOMs normal to gross examination.  Neck:     Musculoskeletal: Normal range of motion.  Cardiovascular:     Rate and Rhythm: Normal rate and regular rhythm.     Comments: Intact, 2+ DP pulse. Abdominal:     General: There is no distension.  Musculoskeletal: Normal range of motion.  Skin:    General: Skin is warm and dry.     Comments: See clinical photo for details.  Patient has a 3 cm deep laceration of the lateral left calf.  On the gastrocnemius patient has one 1 cm puncture wound  followed by 2 smaller and more superficial puncture wounds just distal to this.  Neurological:     Mental Status: She is alert.     Comments: Cranial nerves intact to gross observation. Patient moves extremities without difficulty.  Psychiatric:        Behavior: Behavior normal.        Thought Content: Thought content normal.        Judgment: Judgment normal.           ED Treatments / Results  Labs (all labs ordered are listed, but only abnormal results are displayed) Labs Reviewed - No data to display  EKG None  Radiology Dg Tibia/fibula Left  Result Date: 02/23/2019 CLINICAL DATA:  Animal bite EXAM: LEFT TIBIA AND FIBULA - 2 VIEW COMPARISON:  None. FINDINGS: There is a soft tissue defect involving the lateral aspect of the lower extremity. There is no radiopaque foreign body. There is no underlying acute osseous abnormality. IMPRESSION: Soft tissue defect without evidence of an acute osseous abnormality. Electronically Signed   By:   Constance Holster M.D.   On: 02/23/2019 18:53    Procedures .Marland KitchenLaceration Repair  Date/Time: 02/23/2019 9:03 PM Performed by: Albesa Seen, PA-C Authorized by: Albesa Seen, PA-C   Consent:    Consent obtained:  Verbal   Consent given by:  Patient   Risks discussed:  Infection, pain and need for additional repair Anesthesia (see MAR for exact dosages):    Anesthesia method:  Local infiltration and topical application   Topical anesthetic:  LET   Local anesthetic:  Lidocaine 2% w/o epi Laceration details:    Location:  Leg   Leg location:  L lower leg   Length (cm):  3 Repair type:    Repair type:  Simple Pre-procedure details:    Preparation:  Patient was prepped and draped in usual sterile fashion Exploration:    Hemostasis achieved with:  Direct pressure   Wound exploration: wound explored through full range of motion     Contaminated: no   Treatment:    Area cleansed with:  Saline   Amount of cleaning:  Standard    Irrigation solution:  Sterile saline   Irrigation volume:  1L   Irrigation method:  Syringe Skin repair:    Repair method:  Sutures   Suture size:  4-0   Suture material:  Nylon   Suture technique:  Vertical mattress   Number of sutures:  2 Approximation:    Approximation:  Loose Post-procedure details:    Dressing:  Antibiotic ointment and non-adherent dressing   Patient tolerance of procedure:  Tolerated well, no immediate complications .Marland KitchenLaceration Repair  Date/Time: 02/23/2019 9:05 PM Performed by: Albesa Seen, PA-C Authorized by: Albesa Seen, PA-C   Consent:    Consent obtained:  Verbal   Consent given by:  Patient   Risks discussed:  Infection, pain and need for additional repair Anesthesia (see MAR for exact dosages):    Anesthesia method:  Topical application and local infiltration   Topical anesthetic:  LET   Local anesthetic:  Lidocaine 2% w/o epi Laceration details:    Location:  Leg   Leg location:  L lower leg   Length (cm):  1 Repair type:    Repair type:  Simple Pre-procedure details:    Preparation:  Patient was prepped and draped in usual sterile fashion Exploration:    Hemostasis achieved with:  Direct pressure   Wound exploration: wound explored through full range of motion   Treatment:    Area cleansed with:  Saline and Betadine   Amount of cleaning:  Extensive   Irrigation solution:  Sterile saline   Irrigation volume:  1L Skin repair:    Repair method:  Sutures   Suture size:  4-0   Suture material:  Nylon   Suture technique:  Simple interrupted   Number of sutures:  1 Approximation:    Approximation:  Loose Post-procedure details:    Dressing:  Antibiotic ointment and non-adherent dressing   Patient tolerance of procedure:  Tolerated well, no immediate complications   (including critical care time)  Medications Ordered in ED Medications  polymixin-bacitracin (POLYSPORIN) ointment (has no administration in time range)  Tdap  (BOOSTRIX) injection 0.5 mL (0.5 mLs Intramuscular Given 02/23/19 1825)  HYDROcodone-acetaminophen (NORCO/VICODIN) 5-325 MG per tablet 1 tablet (1 tablet Oral Given 02/23/19 1826)  lidocaine-EPINEPHrine-tetracaine (LET) solution (3 mLs Topical Given 02/23/19 1827)  lidocaine (XYLOCAINE) 2 % (with pres) injection 200 mg (200 mg Infiltration Given 02/23/19 1827)  amoxicillin-clavulanate (AUGMENTIN) 875-125 MG per tablet 1 tablet (1 tablet Oral Given 02/23/19 2008)     Initial Impression / Assessment and Plan / ED Course  I have reviewed the triage vital signs and the nursing notes.  Pertinent labs & imaging results that were available during my care of the patient were reviewed by me and considered in my medical decision making (see chart for details).        This is a well-appearing 53 year old female presenting for dog bite to the left lower extremity.  These were the patient's own animals and she verify vaccination status.  Tdap is updated today.  Given the depth of the wound, loose primary closure indicated.  Wounds were copiously cleansed with normal saline.  Patient started on antibiotic prophylaxis with Augmentin.  She is instructed to return for any increased pain, swelling, pressure, or purulent drainage.  She is instructed to follow-up for wound check with her primary care provider in the next 7 days and return sooner as needed.  Patient is in understanding and agrees with the plan of care.  Final Clinical Impressions(s) / ED Diagnoses   Final diagnoses:  Dog bite of lower extremity    ED Discharge Orders         Ordered    amoxicillin-clavulanate (AUGMENTIN) 875-125 MG tablet     02/23/19 2108    HYDROcodone-acetaminophen (NORCO/VICODIN) 5-325 MG tablet  Every 6 hours PRN     02/23/19 2108  Tamala Julian 02/23/19 2110    Nat Christen, MD 02/25/19 [phone]

## 2019-02-23 NOTE — ED Notes (Signed)
Awaiting eval and repair

## 2019-02-23 NOTE — Discharge Instructions (Signed)
Please see the information and instructions below regarding your visit.  Your diagnoses today include:  1. Dog bite of lower extremity      Tests performed today include: X-ray of the affected area that did not show any foreign bodies or broken bones Vital signs. See below for your results today.   Medications prescribed:   Take any prescribed medications only as directed.  Ibuprofen alternating with Tylenol for pain.   If needed, you may take Norco, 1 tablet every 6 hours as needed for pain. Only take this medication if you need it for breakthrough pain. You may combine this medicine with ibuprofen, a non-steroidal anti-inflammatory drug (NSAID) every 6 hours, so you are getting something for pain relief every 3 hours.  Do not combine this medication with Tylenol, as it may increase the risk of liver problems.  Do not combine this medication with alcohol.  Please be advised to avoid driving or operating heavy machinery while taking this medication, as it may make you drowsy or impair judgment.    Home care instructions:  Follow any educational materials and wound care instructions contained in this packet.   Keep affected area above the level of your heart when possible to minimize swelling. Wash area gently twice a day with warm soapy water. Do not apply alcohol or hydrogen peroxide directly over a wound. Cover the area if it is draining or weeping. Keep the bandage in place for 24 hours and refrain from getting the wound wet for 24 hours. After that, you may get the area wet, but please ensure that you dry it completely afterwards.  Please refrain from soaking sutures for long periods of time, or swimming in chlorinated water   You may apply antibiotic ointment such as Bacitracin or Neosporin.  Follow-up instructions: Suture Removal: Return to the Emergency Department or see your primary care care doctor in 7 days for a recheck of your wound and removal of your sutures or  staples.    Return instructions:  Return to the Emergency Department if you have: Fever Worsening pain Worsening swelling of the wound Pus draining from the wound Redness of the skin that moves away from the wound, especially if it streaks away from the affected area  Any other emergent concerns  Your vital signs today were: BP (!) 117/53 (BP Location: Right Arm)    Pulse 82    Temp 98.4 F (36.9 C) (Oral)    Resp 16    Ht 5\' 7"  (1.702 m)    Wt 83 kg    LMP 08/26/2014    SpO2 96%    BMI 28.66 kg/m  If your blood pressure (BP) was elevated on multiple readings during this visit above 130 for the top number or above 80 for the bottom number, please have this repeated by your primary care provider within one month. --------------  Thank you for allowing [place] to participate in your care today! It was a pleasure taking care of you.

## 2019-02-23 NOTE — ED Notes (Signed)
PA in to repair

## 2019-02-28 ENCOUNTER — Encounter: Payer: Self-pay | Admitting: Nurse Practitioner

## 2019-02-28 ENCOUNTER — Ambulatory Visit (INDEPENDENT_AMBULATORY_CARE_PROVIDER_SITE_OTHER): Payer: No Typology Code available for payment source | Admitting: Nurse Practitioner

## 2019-02-28 ENCOUNTER — Other Ambulatory Visit: Payer: Self-pay

## 2019-02-28 VITALS — BP 118/74 | Temp 98.1°F | Ht 67.0 in

## 2019-02-28 DIAGNOSIS — W540XXD Bitten by dog, subsequent encounter: Secondary | ICD-10-CM

## 2019-02-28 DIAGNOSIS — M79662 Pain in left lower leg: Secondary | ICD-10-CM | POA: Diagnosis not present

## 2019-02-28 DIAGNOSIS — S81812D Laceration without foreign body, left lower leg, subsequent encounter: Secondary | ICD-10-CM | POA: Diagnosis not present

## 2019-02-28 NOTE — Progress Notes (Signed)
   Subjective:    Patient ID: Danielle Duke, female    DOB: 1966/05/04, 53 y.o.   MRN: [id number]  [place] follow u/p. Dog bite on left lower leg. Happened 8/9. Taking ibuprofen 800mg  every 8 hours for pain. Sutures due to be removed on the 8/16th. Patient received tetanus shot at ER. Patient prescribed Augmentin in ED, took last dose today. Denies fever, chills, or shortness of breath. The dog is her pet and has been quarantined. Stopped 2 of her dogs from fighting. States the injury was an accident.   Review of Systems See HPI    Objective:   Physical Exam Musculoskeletal:     Left lower leg: Edema present.  Skin:    Findings: Bruising, ecchymosis, erythema and laceration present.          Comments: Two lacerations noted: one superficial left, lateral leg and one deep posterior aspect of left leg, Slight erythema, sutures in place, wound not fully closed; no evidence of infection        Assessment & Plan:   -Continue to take pain medication as needed. Leave open to air to promote healing. Remove sutures on Monday, August 17th. Discussed warning precautions.

## 2019-03-03 ENCOUNTER — Ambulatory Visit (INDEPENDENT_AMBULATORY_CARE_PROVIDER_SITE_OTHER): Payer: No Typology Code available for payment source | Admitting: Family Medicine

## 2019-03-03 ENCOUNTER — Other Ambulatory Visit: Payer: Self-pay

## 2019-03-03 ENCOUNTER — Encounter: Payer: Self-pay | Admitting: Family Medicine

## 2019-03-03 VITALS — Temp 98.0°F | Ht 67.0 in | Wt 189.8 lb

## 2019-03-03 DIAGNOSIS — W540XXD Bitten by dog, subsequent encounter: Secondary | ICD-10-CM | POA: Diagnosis not present

## 2019-03-03 DIAGNOSIS — R21 Rash and other nonspecific skin eruption: Secondary | ICD-10-CM | POA: Diagnosis not present

## 2019-03-03 NOTE — Progress Notes (Signed)
   Subjective:    Patient ID: Danielle Duke, female    DOB: 1965/10/14, 53 y.o.   MRN: [id number]  HPI  Patient arrives for suture removal from left leg. Patient got bit by her daughter's dog (see 02/28/19 visit)  Patient has been taking Augmentin.  Generally well.  Has had a bit of redness and slight tenderness but no frank infection signs no fever no chills  Stitches have been in for greater than 10 days and need to come out  Review of Systems No headache no chest pain    Objective:   Physical Exam  Alert vitals stable, NAD. Blood pressure good on repeat. HEENT normal. Lungs clear. Heart regular rate and rhythm. Wound no active discharge skin is somewhat open mattress sutures present.  Removed.  Wound looks good afterwards      Assessment & Plan:  Impression suture removal with slight residual persistent linear skin ulceration will heal by secondary intention warning signs discussed

## 2019-03-06 ENCOUNTER — Encounter: Payer: Self-pay | Admitting: Family Medicine

## 2019-03-10 MED FILL — valACYclovir HCL 1 GM TABS: 1 | 90 days supply | Qty: 90 | Fill #0

## 2019-03-11 ENCOUNTER — Encounter (INDEPENDENT_AMBULATORY_CARE_PROVIDER_SITE_OTHER): Payer: Self-pay | Admitting: *Deleted

## 2019-03-11 ENCOUNTER — Ambulatory Visit (INDEPENDENT_AMBULATORY_CARE_PROVIDER_SITE_OTHER): Payer: No Typology Code available for payment source | Admitting: Family Medicine

## 2019-03-11 ENCOUNTER — Encounter: Payer: Self-pay | Admitting: Family Medicine

## 2019-03-11 ENCOUNTER — Other Ambulatory Visit: Payer: Self-pay

## 2019-03-11 VITALS — BP 138/88 | Temp 97.8°F | Ht 67.0 in | Wt 187.8 lb

## 2019-03-11 DIAGNOSIS — Z1329 Encounter for screening for other suspected endocrine disorder: Secondary | ICD-10-CM

## 2019-03-11 DIAGNOSIS — Z79899 Other long term (current) drug therapy: Secondary | ICD-10-CM | POA: Diagnosis not present

## 2019-03-11 DIAGNOSIS — Z0000 Encounter for general adult medical examination without abnormal findings: Secondary | ICD-10-CM

## 2019-03-11 DIAGNOSIS — Z1211 Encounter for screening for malignant neoplasm of colon: Secondary | ICD-10-CM

## 2019-03-11 NOTE — Progress Notes (Signed)
   Subjective:    Patient ID: Danielle Duke, female    DOB: December 22, 1965, 53 y.o.   MRN: [id number]  HPI The patient comes in today for a wellness visit.    A review of their health history was completed.  A review of medications was also completed.  Any needed refills; Valtrex   Eating habits: tries to eat healty  Falls/  MVA accidents in past few months: no falls/accidents. 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  j Additional concerns: none at this time  Exercise overall does some but hoping to do more soon    Looking to get  treadmil for a hum   Review of Systems  Constitutional: Negative for activity change, appetite change and fatigue.  HENT: Negative for congestion and rhinorrhea.   Eyes: Negative for discharge.  Respiratory: Negative for cough, chest tightness and wheezing.   Cardiovascular: Negative for chest pain.  Gastrointestinal: Negative for abdominal pain, blood in stool and vomiting.  Endocrine: Negative for polyphagia.  Genitourinary: Negative for difficulty urinating and frequency.  Musculoskeletal: Negative for neck pain.  Skin: Negative for color change.  Allergic/Immunologic: Negative for environmental allergies and food allergies.  Neurological: Negative for weakness and headaches.  Psychiatric/Behavioral: Negative for agitation and behavioral problems.  All other systems reviewed and are negative.      Objective:   Physical Exam Vitals signs reviewed.  Constitutional:      Appearance: She is well-developed.  HENT:     Head: Normocephalic.     Right Ear: External ear normal.     Left Ear: External ear normal.  Eyes:     Pupils: Pupils are equal, round, and reactive to light.  Neck:     Musculoskeletal: Normal range of motion.     Thyroid: No thyromegaly.  Cardiovascular:     Rate and  Rhythm: Normal rate and regular rhythm.     Heart sounds: Normal heart sounds. No murmur.  Pulmonary:     Effort: Pulmonary effort is normal. No respiratory distress.     Breath sounds: Normal breath sounds. No wheezing.  Abdominal:     General: Bowel sounds are normal. There is no distension.     Palpations: Abdomen is soft. There is no mass.     Tenderness: There is no abdominal tenderness.  Musculoskeletal: Normal range of motion.        General: No tenderness.  Lymphadenopathy:     Cervical: No cervical adenopathy.  Skin:    General: Skin is warm and dry.  Neurological:     Mental Status: She is alert and oriented to person, place, and time.     Motor: No abnormal muscle tone.  Psychiatric:        Behavior: Behavior normal.   Breast exam within normal limits        Assessment & Plan:  Impression 1 well adult exam.  Patient to schedule her own mammogram.  We will work on referral for colonoscopy.  Blood work discussed and reorder.  Diet exercise discussed.  Flu shot to receive this fall.  Exercise strongly encouraged

## 2019-03-14 LAB — CBC WITH DIFFERENTIAL/PLATELET
Basophils Absolute: 0 10*3/uL (ref 0.0–0.2)
Basos: 0 %
EOS (ABSOLUTE): 0.2 10*3/uL (ref 0.0–0.4)
Eos: 4 %
Hematocrit: 39.4 % (ref 34.0–46.6)
Hemoglobin: 13.2 g/dL (ref 11.1–15.9)
Immature Grans (Abs): 0 10*3/uL (ref 0.0–0.1)
Immature Granulocytes: 0 %
Lymphocytes Absolute: 2.2 10*3/uL (ref 0.7–3.1)
Lymphs: 36 %
MCH: 30.1 pg (ref 26.6–33.0)
MCHC: 33.5 g/dL (ref 31.5–35.7)
MCV: 90 fL (ref 79–97)
Monocytes Absolute: 0.3 10*3/uL (ref 0.1–0.9)
Monocytes: 5 %
Neutrophils Absolute: 3.5 10*3/uL (ref 1.4–7.0)
Neutrophils: 55 %
Platelets: 222 10*3/uL (ref 150–450)
RBC: 4.38 x10E6/uL (ref 3.77–5.28)
RDW: 13.6 % (ref 11.7–15.4)
WBC: 6.3 10*3/uL (ref 3.4–10.8)

## 2019-03-14 LAB — LIPID PANEL
Chol/HDL Ratio: 3.9 ratio (ref 0.0–4.4)
Cholesterol, Total: 189 mg/dL (ref 100–199)
HDL: 49 mg/dL (ref >39)
LDL Calculated: 95 mg/dL (ref 0–99)
Triglycerides: 225 mg/dL — ABNORMAL HIGH (ref 0–149)
VLDL Cholesterol Cal: 45 mg/dL — ABNORMAL HIGH (ref 5–40)

## 2019-03-14 LAB — HEPATIC FUNCTION PANEL
ALT: 18 IU/L (ref 0–32)
AST: 13 IU/L (ref 0–40)
Albumin: 4.7 g/dL (ref 3.8–4.9)
Alkaline Phosphatase: 95 IU/L (ref 39–117)
Bilirubin Total: 0.3 mg/dL (ref 0.0–1.2)
Bilirubin, Direct: 0.08 mg/dL (ref 0.00–0.40)
Total Protein: 7 g/dL (ref 6.0–8.5)

## 2019-03-14 LAB — BASIC METABOLIC PANEL
BUN/Creatinine Ratio: 17 (ref 9–23)
BUN: 14 mg/dL (ref 6–24)
CO2: 26 mmol/L (ref 20–29)
Calcium: 9.4 mg/dL (ref 8.7–10.2)
Chloride: 103 mmol/L (ref 96–106)
Creatinine, Ser: 0.81 mg/dL (ref 0.57–1.00)
GFR calc Af Amer: 97 mL/min/{1.73_m2} (ref >59)
GFR calc non Af Amer: 84 mL/min/{1.73_m2} (ref >59)
Glucose: 100 mg/dL — ABNORMAL HIGH (ref 65–99)
Potassium: 4.5 mmol/L (ref 3.5–5.2)
Sodium: 142 mmol/L (ref 134–144)

## 2019-03-14 LAB — TSH: TSH: 1.64 u[IU]/mL (ref 0.450–4.500)

## 2019-03-16 ENCOUNTER — Encounter: Payer: Self-pay | Admitting: Family Medicine

## 2019-03-17 ENCOUNTER — Encounter (INDEPENDENT_AMBULATORY_CARE_PROVIDER_SITE_OTHER): Payer: Self-pay | Admitting: *Deleted

## 2019-06-24 ENCOUNTER — Other Ambulatory Visit: Payer: Self-pay | Admitting: Family Medicine

## 2019-06-25 MED FILL — valACYclovir HCL 1 GM TABS: 1 | 90 days supply | Qty: 90 | Fill #0

## 2019-08-11 ENCOUNTER — Encounter: Payer: Self-pay | Admitting: Family Medicine

## 2019-08-11 ENCOUNTER — Ambulatory Visit (INDEPENDENT_AMBULATORY_CARE_PROVIDER_SITE_OTHER): Payer: No Typology Code available for payment source | Admitting: Family Medicine

## 2019-08-11 ENCOUNTER — Other Ambulatory Visit: Payer: Self-pay

## 2019-08-11 DIAGNOSIS — L309 Dermatitis, unspecified: Secondary | ICD-10-CM | POA: Diagnosis not present

## 2019-08-11 MED ORDER — FLUOCINONIDE 0.05 % EX OINT: TOPICAL_OINTMENT | CUTANEOUS | 2 refills | Status: DC

## 2019-08-11 MED FILL — FLUOCINONIDE 0.05% OINTMENT: 0.05 | 10 days supply | Qty: 30 | Fill #0

## 2019-08-11 NOTE — Progress Notes (Signed)
   Subjective:  Audio plus video  Patient ID: Danielle Duke, female    DOB: 1965-12-30, 54 y.o.   MRN: [id number]  HPIeczema on hands and feet. Worked 12 hours shifts 7 days a week week before last. She states the gloves at work irritate her hands. Tried otc eczema ointment and soaked in salt water.   Virtual Visit via Video Note  I connected with [place] on 08/11/19 at  8:30 AM EST by a video enabled telemedicine application and verified that I am speaking with the correct person using two identifiers.  Location: Patient: home Provider: office   I discussed the limitations of evaluation and management by telemedicine and the availability of in person appointments. The patient expressed understanding and agreed to proceed.  History of Present Illness:    Observations/Objective:   Assessment and Plan:   Follow Up Instructions:    I discussed the assessment and treatment plan with the patient. The patient was provided an opportunity to ask questions and all were answered. The patient agreed with the plan and demonstrated an understanding of the instructions.   The patient was advised to call back or seek an in-person evaluation if the symptoms worsen or if the condition fails to improve as anticipated.  I provided 20 minutes of non-face-to-face time during this encounter.   Patient gives a multiyear history of tendency towards hand eczema.  Currently flared up because of having to wear gloves all the time.  Last flare occurred a bit over a year ago  Given mild steroid cream it really did not seem to take care of things.  Patient using hydrating ointment currently.  Patient frustrated about numerous recurrences       Review of Systems No cough no shortness of breath no headache    Objective:   Physical Exam  Virtual      Assessment & Plan:  Impression dyshidrotic eczema.  In the midst of flare.  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  Discussed.  Lidex cream twice daily to affected area.  Encouraged to see dermatologist we will work on setting and

## 2019-08-20 ENCOUNTER — Encounter: Payer: Self-pay | Admitting: Family Medicine

## 2019-08-22 ENCOUNTER — Encounter: Payer: Self-pay | Admitting: Family Medicine

## 2019-09-18 ENCOUNTER — Encounter (HOSPITAL_COMMUNITY): Payer: Self-pay

## 2019-09-18 ENCOUNTER — Emergency Department (HOSPITAL_COMMUNITY): Payer: No Typology Code available for payment source

## 2019-09-18 ENCOUNTER — Other Ambulatory Visit: Payer: Self-pay

## 2019-09-18 ENCOUNTER — Emergency Department (HOSPITAL_COMMUNITY)
Admission: EM | Admit: 2019-09-18 | Discharge: 2019-09-18 | Disposition: A | Discharge status: Home / Self Care | Payer: No Typology Code available for payment source | Attending: Emergency Medicine | Admitting: Emergency Medicine

## 2019-09-18 DIAGNOSIS — S32028A Other fracture of second lumbar vertebra, initial encounter for closed fracture: Secondary | ICD-10-CM | POA: Insufficient documentation

## 2019-09-18 DIAGNOSIS — Z79899 Other long term (current) drug therapy: Secondary | ICD-10-CM | POA: Insufficient documentation

## 2019-09-18 DIAGNOSIS — Z87891 Personal history of nicotine dependence: Secondary | ICD-10-CM | POA: Insufficient documentation

## 2019-09-18 DIAGNOSIS — W01198A Fall on same level from slipping, tripping and stumbling with subsequent striking against other object, initial encounter: Secondary | ICD-10-CM | POA: Diagnosis not present

## 2019-09-18 DIAGNOSIS — Y939 Activity, unspecified: Secondary | ICD-10-CM | POA: Insufficient documentation

## 2019-09-18 DIAGNOSIS — S32009A Unspecified fracture of unspecified lumbar vertebra, initial encounter for closed fracture: Secondary | ICD-10-CM

## 2019-09-18 DIAGNOSIS — Y929 Unspecified place or not applicable: Secondary | ICD-10-CM | POA: Insufficient documentation

## 2019-09-18 DIAGNOSIS — Y999 Unspecified external cause status: Secondary | ICD-10-CM | POA: Diagnosis not present

## 2019-09-18 DIAGNOSIS — S3992XA Unspecified injury of lower back, initial encounter: Secondary | ICD-10-CM | POA: Diagnosis present

## 2019-09-18 DIAGNOSIS — R109 Unspecified abdominal pain: Secondary | ICD-10-CM | POA: Diagnosis not present

## 2019-09-18 DIAGNOSIS — W19XXXA Unspecified fall, initial encounter: Secondary | ICD-10-CM

## 2019-09-18 DIAGNOSIS — M549 Dorsalgia, unspecified: Secondary | ICD-10-CM

## 2019-09-18 LAB — URINALYSIS, ROUTINE W REFLEX MICROSCOPIC
Bilirubin Urine: NEGATIVE
Glucose, UA: NEGATIVE mg/dL
Hgb urine dipstick: NEGATIVE
Ketones, ur: 20 mg/dL — AB
Leukocytes,Ua: NEGATIVE
Nitrite: NEGATIVE
Protein, ur: NEGATIVE mg/dL
Specific Gravity, Urine: 1.01 (ref 1.005–1.030)
pH: 6 (ref 5.0–8.0)

## 2019-09-18 LAB — CBC
HCT: 38.1 % (ref 36.0–46.0)
Hemoglobin: 12.4 g/dL (ref 12.0–15.0)
MCH: 30.8 pg (ref 26.0–34.0)
MCHC: 32.5 g/dL (ref 30.0–36.0)
MCV: 94.5 fL (ref 80.0–100.0)
Platelets: 191 10*3/uL (ref 150–400)
RBC: 4.03 MIL/uL (ref 3.87–5.11)
RDW: 14 % (ref 11.5–15.5)
WBC: 7.9 10*3/uL (ref 4.0–10.5)
nRBC: 0 % (ref 0.0–0.2)

## 2019-09-18 LAB — COMPREHENSIVE METABOLIC PANEL
ALT: 30 U/L (ref 0–44)
AST: 24 U/L (ref 15–41)
Albumin: 3.9 g/dL (ref 3.5–5.0)
Alkaline Phosphatase: 74 U/L (ref 38–126)
Anion gap: 7 (ref 5–15)
BUN: 16 mg/dL (ref 6–20)
CO2: 23 mmol/L (ref 22–32)
Calcium: 9.1 mg/dL (ref 8.9–10.3)
Chloride: 109 mmol/L (ref 98–111)
Creatinine, Ser: 0.76 mg/dL (ref 0.44–1.00)
GFR calc Af Amer: >60 mL/min (ref >60)
GFR calc non Af Amer: >60 mL/min (ref >60)
Glucose, Bld: 105 mg/dL — ABNORMAL HIGH (ref 70–99)
Potassium: 3.9 mmol/L (ref 3.5–5.1)
Sodium: 139 mmol/L (ref 135–145)
Total Bilirubin: 0.6 mg/dL (ref 0.3–1.2)
Total Protein: 6.8 g/dL (ref 6.5–8.1)

## 2019-09-18 LAB — PREGNANCY, URINE: Preg Test, Ur: NEGATIVE

## 2019-09-18 LAB — I-STAT BETA HCG BLOOD, ED (MC, WL, AP ONLY): I-stat hCG, quantitative: 5.8 m[IU]/mL — ABNORMAL HIGH (ref <5)

## 2019-09-18 MED ORDER — NAPROXEN 500 MG PO TABS: 500.0000 mg | ORAL_TABLET | Freq: Two times a day (BID) | ORAL | 0 refills | Status: DC

## 2019-09-18 MED ORDER — IOHEXOL 300 MG/ML  SOLN
100.0000 mL | Freq: Once | INTRAMUSCULAR | Status: AC | PRN
  Administered 2019-09-18: 17:00:00 100 mL via INTRAVENOUS

## 2019-09-18 MED ORDER — FENTANYL CITRATE (PF) 100 MCG/2ML IJ SOLN
25.0000 ug | Freq: Once | INTRAMUSCULAR | Status: AC
  Administered 2019-09-18: 25 ug via INTRAVENOUS
  Filled 2019-09-18: qty 2

## 2019-09-18 MED ORDER — MORPHINE SULFATE (PF) 4 MG/ML IV SOLN
4.0000 mg | Freq: Once | INTRAVENOUS | Status: DC
  Filled 2019-09-18: qty 1

## 2019-09-18 MED ORDER — KETOROLAC TROMETHAMINE 30 MG/ML IJ SOLN
15.0000 mg | Freq: Once | INTRAMUSCULAR | Status: AC
  Administered 2019-09-18: 15 mg via INTRAVENOUS
  Filled 2019-09-18: qty 1

## 2019-09-18 MED ORDER — FENTANYL CITRATE (PF) 100 MCG/2ML IJ SOLN
50.0000 ug | Freq: Once | INTRAMUSCULAR | Status: AC
  Administered 2019-09-18: 50 ug via INTRAVENOUS
  Filled 2019-09-18: qty 2

## 2019-09-18 MED ORDER — OXYCODONE-ACETAMINOPHEN 5-325 MG PO TABS: 2.0000 | ORAL_TABLET | ORAL | 0 refills | Status: DC | PRN

## 2019-09-18 MED ORDER — METHOCARBAMOL 500 MG PO TABS: 500.0000 mg | ORAL_TABLET | Freq: Two times a day (BID) | ORAL | 0 refills | Status: DC | PRN

## 2019-09-18 MED ORDER — ONDANSETRON HCL 4 MG/2ML IJ SOLN
4.0000 mg | Freq: Once | INTRAMUSCULAR | Status: DC
  Filled 2019-09-18: qty 2

## 2019-09-18 MED ORDER — SODIUM CHLORIDE 0.9 % IV BOLUS
1000.0000 mL | Freq: Once | INTRAVENOUS | Status: AC
  Administered 2019-09-18: 1000 mL via INTRAVENOUS

## 2019-09-18 NOTE — ED Triage Notes (Signed)
Pt slipped in shower and hit r flank area on a ledge in the tub.  Reports burning pain radiating down r leg.

## 2019-09-18 NOTE — ED Notes (Signed)
Pt ambulating in room without brace, no problems noted. Ortho tech present. Ortho tech placed back brace on pt after pt had taken off hospital gown and put on t-shirt. Pt ambulatory to bathroom and back to room. Steady gait noted. Pt states back brace helps her feel secure while ambulating. Eptrucelli, PA made aware.

## 2019-09-18 NOTE — ED Notes (Signed)
Pt to CT

## 2019-09-18 NOTE — ED Notes (Signed)
Pt is describing a burning sensation that starts in her lumbar spine that radiates down into her toes on her right side.

## 2019-09-18 NOTE — Progress Notes (Signed)
Orthopedic Tech Progress Note Patient Details:  Danielle Duke 1966/01/24 [id number] Called in order to [facility] for a TLSO  Patient ID: Danielle Duke, female   DOB: December 23, 1965, 54 y.o.   MRN: [id number]   [name] 09/18/2019, 6:38 PM

## 2019-09-18 NOTE — Discharge Instructions (Signed)
You were seen in the emergency department today after a fall. Your labs were reassuring.   Your CT imaging showed that you have a fracture of your L2 transverse process. These typically heal on their own and we treat them supportively. Please use the back brace for comfort. Try to rest as much as possible.  Apply ice wrapped in a towel 20 minutes on 40 minutes off for the next 24 to 48 hours.  We are sending you with the following medicines to help with pain:  -Percocet-this is a narcotic/controlled substance medication that has potential addicting qualities.  We recommend that you take 1-2 tablets every 6 hours as needed for severe pain.  Do not drive or operate heavy machinery when taking this medicine as it can be sedating. Do not drink alcohol or take other sedating medications when taking this medicine for safety reasons.  Keep this out of reach of small children.  Please be aware this medicine has Tylenol in it (325 mg/tab) do not exceed the maximum dose of Tylenol in a day per over the counter recommendations should you decide to supplement with Tylenol over the counter.   - Naproxen is a nonsteroidal anti-inflammatory medication that will help with pain and swelling. Be sure to take this medication as prescribed with food, 1 pill every 12 hours,  It should be taken with food, as it can cause stomach upset, and more seriously, stomach bleeding. Do not take other nonsteroidal anti-inflammatory medications with this such as Advil, Motrin, Aleve, Mobic, Goodie Powder, or Motrin.    - Robaxin is the muscle relaxer I have prescribed, this is meant to help with muscle tightness. Be aware that this medication may make you drowsy therefore the first time you take this it should be at a time you are in an environment where you can rest. Do not drive or operate heavy machinery when taking this medication. Do not drink alcohol or take other sedating medications with this medicine such as narcotics or  benzodiazepines.   You make take Tylenol per over the counter dosing with these medications.   We have prescribed you new medication(s) today. Discuss the medications prescribed today with your pharmacist as they can have adverse effects and interactions with your other medicines including over the counter and prescribed medications. Seek medical evaluation if you start to experience new or abnormal symptoms after taking one of these medicines, seek care immediately if you start to experience difficulty breathing, feeling of your throat closing, facial swelling, or rash as these could be indications of a more serious allergic reaction  Please follow-up with an orthopedic doctor within 3 days for reevaluation.  Return to the emergency department for new or worsening symptoms including but not limited to worsening pain, numbness or weakness in your legs, loss of control of bowel or bladder function, trouble breathing, coughing up blood, blood in your urine, blood in your stool, chest pain, or any other concerns.

## 2019-09-18 NOTE — ED Provider Notes (Signed)
[facility] EMERGENCY DEPARTMENT Provider Note   CSN: [id number] Arrival date & time: 09/18/19  1111     History Chief Complaint  Patient presents with   Danielle Duke is a 54 y.o. female without significant past medical history who presents to the emergency department status post mechanical fall this morning with complaints of right flank/back pain.  Patient states that she slipped on the wet floor in the bathroom and hit her back on the shower ledge.  Denies head injury or loss of consciousness.  She was unable to get up on her own.  States she is having pain primarily in the right mid to lower back that radiates into the right lower extremity, pain is constant, severe, worse with any movement.  No alleviating factors.  No intervention prior to arrival.  She denies blood thinner use.  She denies headache, neck pain, vomiting, chest pain, abdominal pain, incontinence, numbness, or weakness.  HPI     History reviewed. No pertinent past medical history.  Patient Active Problem List   Diagnosis Date Noted   Wrist pain 12/30/2013   Herpes simplex type 2 infection 05/22/2013    Past Surgical History:  Procedure Laterality Date   BACK SURGERY       OB History   No obstetric history on file.     Family History  Problem Relation Age of Onset   Diabetes Father    Hypertension Father     Social History   Tobacco Use   Smoking status: Former Smoker    Packs/day: 0.60    Types: Cigarettes   Smokeless tobacco: Never Used  Substance Use Topics   Alcohol use: Yes    Comment: rare social   Drug use: No    Home Medications Prior to Admission medications   Medication Sig Start Date End Date Taking? Authorizing Provider  fluocinonide ointment (LIDEX) 0.05 % Apply a thin layer bid to affected area. Do not use greater than 10 days in a row. 08/11/19   Mikey Kirschner, MD  ibuprofen (ADVIL,MOTRIN) 800 MG tablet Take 1 tablet (800 mg total) by mouth 3  (three) times daily. 05/23/18   Zigmund Gottron, NP  valACYclovir (VALTREX) 1000 MG tablet TAKE 1 TABLET BY MOUTH ONCE DAILY 06/25/19   Mikey Kirschner, MD    Allergies    Wellbutrin [bupropion]  Review of Systems   Review of Systems  Constitutional: Negative for chills and fever.  Respiratory: Negative for shortness of breath.   Cardiovascular: Negative for chest pain.  Gastrointestinal: Negative for abdominal pain, nausea and vomiting.  Musculoskeletal: Positive for back pain and myalgias.  Neurological: Negative for weakness and numbness.       Negative for incontinence.  All other systems reviewed and are negative.   Physical Exam Updated Vital Signs BP (!) 128/47 (BP Location: Left Arm)    Pulse 74    Temp 98.2 F (36.8 C) (Oral)    Resp 18    Ht 5\' 7"  (1.702 m)    Wt 81.6 kg    LMP 08/26/2014    SpO2 100%    BMI 28.19 kg/m   Physical Exam Vitals and nursing note reviewed.  Constitutional:      General: She is in acute distress (Appears uncomfortable.).     Appearance: She is well-developed.  HENT:     Head: Normocephalic and atraumatic. No raccoon eyes or Battle's sign.     Right Ear: No hemotympanum.  Left Ear: No hemotympanum.  Eyes:     General:        Right eye: No discharge.        Left eye: No discharge.     Conjunctiva/sclera: Conjunctivae normal.     Pupils: Pupils are equal, round, and reactive to light.  Cardiovascular:     Rate and Rhythm: Normal rate and regular rhythm.     Heart sounds: No murmur.     Comments: 2+ symmetric radial, DP, and PT pulses. Pulmonary:     Effort: No respiratory distress.     Breath sounds: Normal breath sounds. No wheezing, rhonchi or rales.  Chest:     Chest wall: No tenderness.  Abdominal:     General: There is no distension.     Palpations: Abdomen is soft.     Tenderness: There is abdominal tenderness in the right upper quadrant and right lower quadrant. There is right CVA tenderness. There is no left CVA  tenderness, guarding or rebound.  Musculoskeletal:     Cervical back: Normal range of motion and neck supple. No spinous process tenderness.     Comments: No obvious deformity, ecchymosis, or open wounds. Upper extremities: Intact active range of motion.  No point/focal bony tenderness Back: Patient is tender over the right lumbar and lower thoracic paraspinal muscles as well as to the midline lumbar spine.  No point/focal vertebral tenderness.  No palpable step-off. Lower extremities: Patient is able to move some in each joint but states that when she moves her legs it hurts in her back.  She is tender to palpation to the right posterior hip, she otherwise has no focal bony tenderness throughout the lower extremities.  Skin:    General: Skin is warm and dry.     Findings: No rash.  Neurological:     Mental Status: She is alert.     Comments: Alert.  Clear speech.  CN III through XII grossly intact.  Sensation grossly intact bilateral upper and lower extremities.  5 out of 5 symmetric grip strength.  5-5 strength with plantar dorsiflexion bilaterally.  Psychiatric:        Behavior: Behavior normal.     ED Results / Procedures / Treatments   Labs (all labs ordered are listed, but only abnormal results are displayed) Labs Reviewed  COMPREHENSIVE METABOLIC PANEL - Abnormal; Notable for the following components:      Result Value   Glucose, Bld 105 (*)    All other components within normal limits  URINALYSIS, ROUTINE W REFLEX MICROSCOPIC - Abnormal; Notable for the following components:   Ketones, ur 20 (*)    All other components within normal limits  I-STAT BETA HCG BLOOD, ED ([facility], [facility], [facility] ONLY) - Abnormal; Notable for the following components:   I-stat hCG, quantitative 5.8 (*)    All other components within normal limits  CBC  PREGNANCY, URINE    EKG None  Radiology CT Abdomen Pelvis W Contrast  Result Date: 09/18/2019 CLINICAL DATA:  Abdominal trauma. Patient slipped in the  shower and hit her right flank. She reports burning pain radiating down the right leg. EXAM: CT ABDOMEN AND PELVIS WITH CONTRAST TECHNIQUE: Multidetector CT imaging of the abdomen and pelvis was performed using the standard protocol following bolus administration of intravenous contrast. Automatic exposure control utilized. CONTRAST:  120mL OMNIPAQUE IOHEXOL 300 MG/ML  SOLN COMPARISON:  No prior. FINDINGS: Lower chest: A 5 mm next noncalcified pleural based nodule in the right middle lobe, series  5, image 10. Minimal subpleural atelectasis in both lower lungs. Small hiatal hernia. Borderline cardiomegaly. No dependent pleural fluid. Hepatobiliary: Normal hepatic size and normal smooth hepatic contours without intrahepatic duct dilatation. Patent portal veins. Normal gallbladder. Mild 8 mm dilatation of the proximal common bile duct with normal distal tapering. 8 mm and 5 mm and 4 mm hepatic hypoattenuating lesion better incompletely characterized, but likely benign hemangiomas or cysts based on size criteria; in the absence of a known malignancy no further imaging evaluation is indicated. Pancreas: Normal. Spleen: Normal. Adrenals/Urinary Tract: Normal. Stomach/Bowel: Mild diverticulosis coli. Small stool burden. Normal appendix on axial series 2, image 70. No bowel obstruction or apparent bowel wall thickening. Decompressed gastric lumen without an apparent abnormality. 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 Mild abdominal aortic calcified atherosclerosis without aneurysmal dilatation. No adenopathy. Reproductive: No apparent abnormality. Other: No free intraperitoneal fluid or air. No retroperitoneal hematoma. Small periumbilical herniation of fat without strangulation or incarceration. Musculoskeletal: Acute fracture through the L2 right transverse process. Chronic blunted contours of the L4 and L5 superior endplates similar to the October 23, 2006 MR lumbar spine examination. No acute lumbar compression fracture deformity. Small intravertebral disc herniation at T11 and T9. Chronic advanced disc space narrowing with endplate subchondral sclerosis ease and prominent osteophyte disc complexes at the lumbosacral junction with moderate disc herniation, severe osseous narrowing of the right neural foramen and moderate osseous narrowing of the left neural foramen at L5-S1. Benign sacral bone island. Intact bony pelvis and bilateral hips. No evidence for a lower rib fracture. IMPRESSION: No acute retroperitoneal hematoma or abdominopelvic soft tissue trauma. Acute fracture through the L2 right transverse process. Moderate disc herniation and advanced osteoarthritis at the lumbosacral junction with severe osseous narrowing of the right neural foramen and disc space. Electronically Signed   By: Revonda Humphrey   On: 09/18/2019 17:52   CT L-SPINE NO CHARGE  Result Date: 09/18/2019 CLINICAL DATA:  Fall with burning pain radiating down the right leg. EXAM: CT LUMBAR SPINE WITHOUT CONTRAST TECHNIQUE: Multidetector CT imaging of the lumbar spine was performed without intravenous contrast administration. Multiplanar CT image reconstructions were also generated. COMPARISON:  None. FINDINGS: Segmentation: Five non-rib-bearing lumbar vertebral bodies. Chronic degenerative endplate changes. Alignment: No spondylolisthesis.Straightening of the lumbar lordosis. Vertebrae: Acute fracture through the L2 right transverse process without intracanalicular extension. No acute compression fracture. Paraspinal and other soft tissues: No retroperitoneal hematoma or acute paraspinal soft tissue injury. Aorta calcified atherosclerosis without an abdominal aortic aneurysm. Disc levels: Advanced disc space narrowing and right neural foraminal narrowing at the lumbosacral junction. Associated moderate disc herniation and moderate left neural foraminal narrowing. IMPRESSION: Acute  fracture through the L2 right transverse process without intracanalicular extension. Chronic disc herniation and advanced degenerative change at the lumbosacral disc space and right neural foramen. Cervical muscle spasm. Aortic calcified atherosclerosis. Electronically Signed   By: Revonda Humphrey   On: 09/18/2019 17:59   DG Hip Unilat With Pelvis 2-3 Views Right  Result Date: 09/18/2019 CLINICAL DATA:  Right hip pain after fall today. EXAM: DG HIP (WITH OR WITHOUT PELVIS) 2-3V RIGHT COMPARISON:  None. FINDINGS: There is no evidence of hip fracture or dislocation. There is no evidence of arthropathy or other focal bone abnormality. IMPRESSION: Negative. Electronically Signed   By: Marijo Conception M.D.   On: 09/18/2019 13:25    Procedures Procedures (including critical care time)  Medications Ordered in ED Medications  fentaNYL (SUBLIMAZE) injection 50 mcg (50 mcg Intravenous Given 09/18/19 1252)  sodium chloride 0.9 % bolus 1,000 mL (0 mLs Intravenous Stopped 09/18/19 1522)  iohexol (OMNIPAQUE) 300 MG/ML solution 100 mL (100 mLs Intravenous Contrast Given 09/18/19 1659)  fentaNYL (SUBLIMAZE) injection 25 mcg (25 mcg Intravenous Given 09/18/19 1543)  fentaNYL (SUBLIMAZE) injection 50 mcg (50 mcg Intravenous Given 09/18/19 1800)  ketorolac (TORADOL) 30 MG/ML injection 15 mg (15 mg Intravenous Given 09/18/19 1848)    ED Course  I have reviewed the triage vital signs and the nursing notes.  Pertinent labs & imaging results that were available during my care of the patient were reviewed by me and considered in my medical decision making (see chart for details).    MDM Rules/Calculators/A&P                      Patient presents to the emergency department status post mechanical fall with complaints of right sided back pain into the right lower extremity.  She appears uncomfortable but nontoxic.  She has tenderness to the midline lumbar spine as well as the right lumbar and thoracic paraspinal muscles in the  right CVA region as well as to the right upper and lower quadrant of the abdomen.  No overlying skin changes including no ecchymosis or open wounds.  Plan for labs, right hip x-ray, CT abdomen/pelvis.  Fentanyl for pain.  Labs unremarkable.  CT imaging notable for acute fracture to the L2 right transverse process without intracanalicular extension.  She has chronic disc herniation and advanced degenerative changes throughout the lumbosacral disc space and right neural foramen.   Acute fracture through the L2 right transverse process without intracanalicular extension. Chronic disc herniation and advanced degenerative change at the lumbosacral disc space and right neural foramen. Cervical muscle spasm. Aortic calcified atherosclerosis.  No other acute injury noted.  Patient given multiple analgesics in the emergency department with improvement, she ultimately would prefer to have a brace which I feel is reasonable.  18:32: CONSULT: Discussed with orthopedics team regarding TLSO, will deliver to ED.  22:50: RE-EVAL: TLSO placed by orthopedics, patient feeling much better, ambulatory, remains with no neuro deficits, appears appropriate for discharge home.   I discussed results, treatment plan, need for follow-up, and return precautions with the patient. Provided opportunity for questions, patient confirmed understanding and is in agreement with plan.   Discussed with supervising physician Dr. Lacinda Axon and subsequently Dr.Trifan @ change of shift who are in agreement.   Final Clinical Impression(s) / ED Diagnoses Final diagnoses:  Back pain  Fall, initial encounter  Closed fracture of transverse process of lumbar vertebra, initial encounter [facility])    Rx / DC Orders ED Discharge Orders         Ordered    naproxen (NAPROSYN) 500 MG tablet  2 times daily     09/18/19 2107    methocarbamol (ROBAXIN) 500 MG tablet  2 times daily PRN     09/18/19 2107    oxyCODONE-acetaminophen (PERCOCET/ROXICET)  5-325 MG tablet  Every 4 hours PRN     09/18/19 2108           Leafy Kindle 09/18/19 2137    Nat Christen, MD 09/19/19 [phone]

## 2019-09-19 MED FILL — Oxycodone w/ Acetaminophen Tab 5-325 MG: ORAL | Qty: 6 | Status: AC

## 2019-09-22 ENCOUNTER — Ambulatory Visit (INDEPENDENT_AMBULATORY_CARE_PROVIDER_SITE_OTHER): Payer: No Typology Code available for payment source | Admitting: Family Medicine

## 2019-09-22 ENCOUNTER — Other Ambulatory Visit: Payer: Self-pay

## 2019-09-22 DIAGNOSIS — T148XXA Other injury of unspecified body region, initial encounter: Secondary | ICD-10-CM

## 2019-09-22 DIAGNOSIS — S32009D Unspecified fracture of unspecified lumbar vertebra, subsequent encounter for fracture with routine healing: Secondary | ICD-10-CM

## 2019-09-22 DIAGNOSIS — W19XXXD Unspecified fall, subsequent encounter: Secondary | ICD-10-CM

## 2019-09-22 DIAGNOSIS — M545 Low back pain: Secondary | ICD-10-CM

## 2019-09-22 MED ORDER — OXYCODONE-ACETAMINOPHEN 5-325 MG PREPACK: ORAL_TABLET | ORAL | 0 refills | Status: DC

## 2019-09-22 MED ORDER — OXYCODONE-ACETAMINOPHEN 5-325 MG PO TABS: ORAL_TABLET | ORAL | 0 refills | Status: DC

## 2019-09-22 NOTE — Addendum Note (Signed)
Addended by: Mikey Kirschner on: 09/22/2019 02:32 PM   Modules accepted: Orders

## 2019-09-22 NOTE — Addendum Note (Signed)
Addended by: Carmelina Noun on: 09/22/2019 01:49 PM   Modules accepted: Orders

## 2019-09-22 NOTE — Addendum Note (Signed)
Addended by: Carmelina Noun on: 09/22/2019 01:58 PM   Modules accepted: Orders

## 2019-09-22 NOTE — Addendum Note (Signed)
Addended by: Carmelina Noun on: 09/22/2019 02:23 PM   Modules accepted: Orders

## 2019-09-22 NOTE — Addendum Note (Signed)
Addended by: Carmelina Noun on: 09/22/2019 02:17 PM   Modules accepted: Orders

## 2019-09-22 NOTE — Progress Notes (Signed)
   Subjective:  Audiovideo  Patient ID: Danielle Duke, female    DOB: [birth year], 54 y.o.   MRN: [id number]  HPI  Follow up ED visit. Went to Ed on 3/4 after having a fall. Pt has fracture through the L2 right transverse process.   Virtual Visit via Video Note  I connected with [place] on 09/22/19 at 11:00 AM EST by a video enabled telemedicine application and verified that I am speaking with the correct person using two identifiers.  Location: Patient: home Provider: office   I discussed the limitations of evaluation and management by telemedicine and the availability of in person appointments. The patient expressed understanding and agreed to proceed.  History of Present Illness:    Observations/Objective:   Assessment and Plan:   Follow Up Instructions:    I discussed the assessment and treatment plan with the patient. The patient was provided an opportunity to ask questions and all were answered. The patient agreed with the plan and demonstrated an understanding of the instructions.   The patient was advised to call back or seek an in-person evaluation if the symptoms worsen or if the condition fails to improve as anticipated.  I provided67minutes of non-face-to-face time during this encounter.  Complete emergency room record reviewed  Patient took a fall in her bathroom.  Struck her back very hard.  Had immediate severe pain.  Had to go via EMS  Work-up revealed L2 transverse fracture with no impingement on the canal  Notes ongoing severe pain  Review of Systems No headache no chest pain    Objective:   Physical Exam  Virtual      Assessment & Plan:  Impression transverse process fracture.  L2.  Patient wearing bracing gaining some benefit.  Pain medicine use described.  To maintain baseline Naprosyn and add pain medicine as needed.  Orthopedic referral

## 2019-09-22 NOTE — Addendum Note (Signed)
Addended by: Mikey Kirschner on: 09/22/2019 02:18 PM   Modules accepted: Orders

## 2019-09-26 ENCOUNTER — Other Ambulatory Visit: Payer: Self-pay | Admitting: Family Medicine

## 2019-09-26 MED FILL — HYDROCODON-APAP 5-325: 5-325 | 15 days supply | Qty: 30 | Fill #0

## 2019-09-26 MED FILL — METHYLPREDNISOLONE 4 MG TAB: 4 | 6 days supply | Qty: 21 | Fill #0

## 2019-09-26 MED FILL — GABAPENTIN 300 MG CAPSULE: 300 | 30 days supply | Qty: 90 | Fill #0

## 2019-09-26 MED FILL — valACYclovir HCL 1 GM TABS: 1 | 90 days supply | Qty: 90 | Fill #0

## 2019-09-26 NOTE — Telephone Encounter (Signed)
Wellness 03/11/19.

## 2019-12-31 ENCOUNTER — Telehealth: Payer: Self-pay | Admitting: *Deleted

## 2019-12-31 DIAGNOSIS — Z0000 Encounter for general adult medical examination without abnormal findings: Secondary | ICD-10-CM

## 2019-12-31 DIAGNOSIS — Z79899 Other long term (current) drug therapy: Secondary | ICD-10-CM

## 2019-12-31 DIAGNOSIS — Z1329 Encounter for screening for other suspected endocrine disorder: Secondary | ICD-10-CM

## 2019-12-31 MED ORDER — VALACYCLOVIR HCL 1 G PO TABS: 1000.0000 mg | ORAL_TABLET | Freq: Every day | ORAL | 0 refills | Status: DC

## 2019-12-31 NOTE — Telephone Encounter (Signed)
pls set up pt to be seen for f/u in 3-4 months.  Thx. Dr. Darene Lamer

## 2020-01-01 MED FILL — valACYclovir HCL 1 GM TABS: 1 | 30 days supply | Qty: 30 | Fill #0

## 2020-01-01 NOTE — Telephone Encounter (Signed)
Yes fine to order those, thx. Dr. Lovena Le

## 2020-01-01 NOTE — Telephone Encounter (Signed)
Pt has cpe scheduled 8/26 and pt would like lab work done before appt.

## 2020-01-01 NOTE — Telephone Encounter (Signed)
Last labs completed 03/13/2019 CBC, Lipid, Hepatic, TSH, and BMET. Please advise. Thank you

## 2020-01-02 NOTE — Addendum Note (Signed)
Addended by: Vicente Males on: 01/02/2020 08:39 AM   Modules accepted: Orders

## 2020-01-02 NOTE — Telephone Encounter (Signed)
Lab orders placed and pt is aware. Lab orders mailed to patient as a reminder

## 2020-02-03 MED FILL — valACYclovir HCL 1 GM TABS: 1 | 30 days supply | Qty: 30 | Fill #1

## 2020-03-08 MED FILL — valACYclovir HCL 1 GM TABS: 1 | 30 days supply | Qty: 30 | Fill #2

## 2020-03-11 ENCOUNTER — Other Ambulatory Visit: Payer: Self-pay

## 2020-03-11 ENCOUNTER — Encounter: Payer: Self-pay | Admitting: Family Medicine

## 2020-03-11 ENCOUNTER — Other Ambulatory Visit: Payer: Self-pay | Admitting: Family Medicine

## 2020-03-11 ENCOUNTER — Ambulatory Visit (INDEPENDENT_AMBULATORY_CARE_PROVIDER_SITE_OTHER): Payer: No Typology Code available for payment source | Admitting: Family Medicine

## 2020-03-11 VITALS — BP 110/70 | HR 63 | Temp 97.5°F | Ht 65.5 in | Wt 191.6 lb

## 2020-03-11 DIAGNOSIS — Z1211 Encounter for screening for malignant neoplasm of colon: Secondary | ICD-10-CM | POA: Diagnosis not present

## 2020-03-11 DIAGNOSIS — Z1231 Encounter for screening mammogram for malignant neoplasm of breast: Secondary | ICD-10-CM | POA: Diagnosis not present

## 2020-03-11 DIAGNOSIS — Z01419 Encounter for gynecological examination (general) (routine) without abnormal findings: Secondary | ICD-10-CM

## 2020-03-11 DIAGNOSIS — Z0000 Encounter for general adult medical examination without abnormal findings: Secondary | ICD-10-CM | POA: Diagnosis not present

## 2020-03-11 DIAGNOSIS — B009 Herpesviral infection, unspecified: Secondary | ICD-10-CM | POA: Diagnosis not present

## 2020-03-11 MED ORDER — VALACYCLOVIR HCL 1 G PO TABS: 1000.0000 mg | ORAL_TABLET | Freq: Every day | ORAL | 3 refills | Status: DC
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
## 2020-03-12 LAB — BASIC METABOLIC PANEL
BUN/Creatinine Ratio: 17 (ref 9–23)
BUN: 14 mg/dL (ref 6–24)
CO2: 23 mmol/L (ref 20–29)
Calcium: 9.5 mg/dL (ref 8.7–10.2)
Chloride: 103 mmol/L (ref 96–106)
Creatinine, Ser: 0.84 mg/dL (ref 0.57–1.00)
GFR calc Af Amer: 92 mL/min/{1.73_m2} (ref >59)
GFR calc non Af Amer: 80 mL/min/{1.73_m2} (ref >59)
Glucose: 94 mg/dL (ref 65–99)
Potassium: 4.8 mmol/L (ref 3.5–5.2)
Sodium: 139 mmol/L (ref 134–144)

## 2020-03-12 LAB — HEPATIC FUNCTION PANEL
ALT: 20 IU/L (ref 0–32)
AST: 18 IU/L (ref 0–40)
Albumin: 4.5 g/dL (ref 3.8–4.9)
Alkaline Phosphatase: 95 IU/L (ref 48–121)
Bilirubin Total: 0.3 mg/dL (ref 0.0–1.2)
Bilirubin, Direct: 0.09 mg/dL (ref 0.00–0.40)
Total Protein: 7.2 g/dL (ref 6.0–8.5)

## 2020-03-12 LAB — LIPID PANEL
Chol/HDL Ratio: 4.4 ratio (ref 0.0–4.4)
Cholesterol, Total: 189 mg/dL (ref 100–199)
HDL: 43 mg/dL (ref >39)
LDL Chol Calc (NIH): 103 mg/dL — ABNORMAL HIGH (ref 0–99)
Triglycerides: 251 mg/dL — ABNORMAL HIGH (ref 0–149)
VLDL Cholesterol Cal: 43 mg/dL — ABNORMAL HIGH (ref 5–40)

## 2020-03-12 LAB — CBC WITH DIFFERENTIAL/PLATELET
Basophils Absolute: 0 10*3/uL (ref 0.0–0.2)
Basos: 0 %
EOS (ABSOLUTE): 0.2 10*3/uL (ref 0.0–0.4)
Eos: 3 %
Hematocrit: 38.8 % (ref 34.0–46.6)
Hemoglobin: 13.4 g/dL (ref 11.1–15.9)
Immature Grans (Abs): 0 10*3/uL (ref 0.0–0.1)
Immature Granulocytes: 0 %
Lymphocytes Absolute: 2.1 10*3/uL (ref 0.7–3.1)
Lymphs: 32 %
MCH: 31.1 pg (ref 26.6–33.0)
MCHC: 34.5 g/dL (ref 31.5–35.7)
MCV: 90 fL (ref 79–97)
Monocytes Absolute: 0.4 10*3/uL (ref 0.1–0.9)
Monocytes: 6 %
Neutrophils Absolute: 4 10*3/uL (ref 1.4–7.0)
Neutrophils: 59 %
Platelets: 231 10*3/uL (ref 150–450)
RBC: 4.31 x10E6/uL (ref 3.77–5.28)
RDW: 13.5 % (ref 11.7–15.4)
WBC: 6.7 10*3/uL (ref 3.4–10.8)

## 2020-03-12 LAB — TSH: TSH: 1.48 u[IU]/mL (ref 0.450–4.500)

## 2020-03-15 ENCOUNTER — Ambulatory Visit (HOSPITAL_COMMUNITY): Payer: No Typology Code available for payment source

## 2020-03-15 ENCOUNTER — Encounter: Payer: Self-pay | Admitting: Family Medicine

## 2020-03-15 DIAGNOSIS — E785 Hyperlipidemia, unspecified: Secondary | ICD-10-CM | POA: Insufficient documentation

## 2020-03-17 LAB — IGP, APTIMA HPV: HPV Aptima: POSITIVE — AB

## 2020-03-18 ENCOUNTER — Encounter: Payer: Self-pay | Admitting: Family Medicine

## 2020-03-18 DIAGNOSIS — R87810 Cervical high risk human papillomavirus (HPV) DNA test positive: Secondary | ICD-10-CM | POA: Insufficient documentation

## 2020-03-25 ENCOUNTER — Ambulatory Visit (HOSPITAL_COMMUNITY): Admission: RE | Admit: 2020-03-25 | Payer: No Typology Code available for payment source | Source: Ambulatory Visit

## 2020-03-25 ENCOUNTER — Other Ambulatory Visit: Payer: Self-pay

## 2020-04-12 MED FILL — valACYclovir HCL 1 GM TABS: 1 | 90 days supply | Qty: 90 | Fill #0

## 2020-05-19 ENCOUNTER — Ambulatory Visit (HOSPITAL_COMMUNITY)
Admission: RE | Admit: 2020-05-19 | Discharge: 2020-05-19 | Disposition: A | Discharge status: Home / Self Care | Payer: No Typology Code available for payment source | Source: Ambulatory Visit | Attending: Family Medicine | Admitting: Family Medicine

## 2020-05-19 ENCOUNTER — Other Ambulatory Visit: Payer: Self-pay

## 2020-05-19 DIAGNOSIS — Z1231 Encounter for screening mammogram for malignant neoplasm of breast: Secondary | ICD-10-CM | POA: Insufficient documentation

## 2020-07-15 MED FILL — valACYclovir HCL 1 GM TABS: 1 | 90 days supply | Qty: 90 | Fill #1

## 2020-09-06 ENCOUNTER — Other Ambulatory Visit (HOSPITAL_COMMUNITY): Payer: Self-pay

## 2020-09-07 MED FILL — AMOX TR-K CLV 875-125 MG TA: 875-125 | 7 days supply | Qty: 14 | Fill #0

## 2020-12-20 ENCOUNTER — Other Ambulatory Visit (HOSPITAL_COMMUNITY): Payer: Self-pay

## 2020-12-20 MED FILL — Valacyclovir HCl Tab 1 GM: ORAL | 90 days supply | Qty: 90 | Fill #0 | Status: AC

## 2020-12-27 ENCOUNTER — Encounter: Payer: Self-pay | Admitting: Nurse Practitioner

## 2020-12-27 ENCOUNTER — Telehealth: Payer: No Typology Code available for payment source | Admitting: Nurse Practitioner

## 2020-12-27 DIAGNOSIS — J029 Acute pharyngitis, unspecified: Secondary | ICD-10-CM

## 2020-12-27 MED ORDER — AMOXICILLIN 875 MG PO TABS: 875.0000 mg | ORAL_TABLET | Freq: Two times a day (BID) | ORAL | 0 refills | Status: DC

## 2020-12-27 MED ORDER — PREDNISONE 20 MG PO TABS: 40.0000 mg | ORAL_TABLET | Freq: Every day | ORAL | 0 refills | Status: AC

## 2020-12-27 NOTE — Progress Notes (Signed)
[name], Duke are scheduled for a virtual visit with Mary-Margaret Sostenes Kauffmann< FNP today.    Just as we do with appointments in the office, we must obtain your consent to participate.  Your consent will be active for this visit and any virtual visit you may have with one of our providers in the next 365 days.    If you have a MyChart account, I can also send a copy of this consent to you electronically.  All virtual visits are billed to your insurance company just like a traditional visit in the office.  As this is a virtual visit, video technology does not allow for your provider to perform a traditional examination.  This may limit your provider's ability to fully assess your condition.  If your provider identifies any concerns that need to be evaluated in person or the need to arrange testing such as labs, EKG, etc, we will make arrangements to do so.    Although advances in technology are sophisticated, we cannot ensure that it will always work on either your end or our end.  If the connection with a video visit is poor, we may have to switch to a telephone visit.  With either a video or telephone visit, we are not always able to ensure that we have a secure connection.   I need to obtain your verbal consent now.   Are you willing to proceed with your visit today?   Danielle Duke has provided verbal consent on 12/27/2020 for a virtual visit (video or telephone).  Virtual Visit via Video  I connected with  Danielle Duke  on 12/27/20 at 3:30 by video and verified that I am speaking with the correct person using two identifiers. Danielle Duke is currently located at home and no ne is currently with her during visit. The provider, Mary-Margaret Hassell Done, FNP is located at home at time of visit.  I discussed the limitations, risks, security and privacy concerns of performing an evaluation and management service by video  and the availability of in person appointments. I also discussed with the  patient that there may be a patient responsible charge related to this service. The patient expressed understanding and agreed to proceed.   Subjective:   HPI:   Patient has had a severe sore throat for 4-5 days. Has gotten worse. Neck feels swollen. She has been using motrin OTC for pain relfief which has helped some.  Review of Systems  Constitutional:  Positive for malaise/fatigue. Negative for chills and fever.  HENT:  Positive for sore throat. Negative for congestion, ear discharge, ear pain and sinus pain.   Respiratory:  Positive for cough (slight).   Neurological:  Positive for headaches.    See pertinent positives and negatives per HPI.  Patient Active Problem List   Diagnosis Date Noted   Cervical high risk HPV (human papillomavirus) test positive 03/18/2020   Hyperlipidemia 03/15/2020   Wrist pain 12/30/2013   Herpes simplex type 2 infection 05/22/2013    Social History   Tobacco Use   Smoking status: Former    Packs/day: 0.60    Pack years: 0.00    Types: Cigarettes   Smokeless tobacco: Never  Substance Use Topics   Alcohol use: Yes    Comment: rare social    Current Outpatient Medications:    amoxicillin-clavulanate (AUGMENTIN) 875-125 MG tablet, TAKE 1 TABLET BY MOUTH 2 TIMES A DAY, Disp: 14 tablet, Rfl: 0   Biotin 1 MG CAPS, Take 1 capsule  by mouth daily., Disp: , Rfl:    cetirizine (ZYRTEC) 10 MG tablet, Take 10 mg by mouth daily., Disp: , Rfl:    fluocinonide ointment (LIDEX) 0.05 %, Apply a thin layer bid to affected area. Do not use greater than 10 days in a row., Disp: 30 g, Rfl: 2   fluticasone (FLONASE) 50 MCG/ACT nasal spray, Place 1 spray into both nostrils daily., Disp: , Rfl:    Multiple Vitamin (MULTIVITAMIN WITH MINERALS) TABS tablet, Take 1 tablet by mouth daily., Disp: , Rfl:    naproxen (NAPROSYN) 500 MG tablet, Take 1 tablet (500 mg total) by mouth 2 (two) times daily., Disp: 10 tablet, Rfl: 0   valACYclovir (VALTREX) 1000 MG tablet, TAKE  1 TABLET (1,000 MG TOTAL) BY MOUTH DAILY., Disp: 90 tablet, Rfl: 3  Allergies  Allergen Reactions   Wellbutrin [Bupropion]     Stuttering and messed up thought process    Objective:   LMP 08/26/2014   Patient is well-developed, well-nourished in no acute distress.  Resting comfortably  at home.  Head is normocephalic, atraumatic.  No labored breathing.  Speech is clear and coherent with logical content.  Patient is alert and oriented at baseline.  Post oral pharynx erythematous and edematous.  Assessment and Plan:        Danielle Duke in today with chief complaint of No chief complaint on file.   1. Pharyngitis, unspecified etiology Force fluids Motrin or tylenol OTC OTC decongestant Throat lozenges if help New toothbrush in 3 days Meds ordered this encounter  Medications   amoxicillin (AMOXIL) 875 MG tablet    Sig: Take 1 tablet (875 mg total) by mouth 2 (two) times daily. 1 po BID    Dispense:  20 tablet    Refill:  0    Order Specific Question:   Supervising Provider    Answer:   MILLER, BRIAN [[id number]]   predniSONE (DELTASONE) 20 MG tablet    Sig: Take 2 tablets (40 mg total) by mouth daily with breakfast for 5 days. 2 po daily for 5 days    Dispense:  10 tablet    Refill:  0    Order Specific Question:   Supervising Provider    Answer:   Noemi Chapel [[id number]]       The above assessment and management plan was discussed with the patient. The patient verbalized understanding of and has agreed to the management plan. Patient is aware to call the clinic if symptoms persist or worsen. Patient is aware when to return to the clinic for a follow-up visit. Patient educated on when it is appropriate to go to the emergency department.   Grant, FNP  .   Mutual, FNP 12/27/2020  Time spent with the patient: 15 minutes, of which >50% was spent in obtaining information about symptoms, reviewing previous labs, evaluations, and treatments,  counseling about condition (please see the discussed topics above), and developing a plan to further investigate it; had a number of questions which I addressed.  Video

## 2021-04-06 ENCOUNTER — Other Ambulatory Visit: Payer: Self-pay

## 2021-04-06 ENCOUNTER — Other Ambulatory Visit (HOSPITAL_COMMUNITY): Payer: Self-pay

## 2021-04-06 ENCOUNTER — Other Ambulatory Visit: Payer: Self-pay | Admitting: Family Medicine

## 2021-04-11 ENCOUNTER — Other Ambulatory Visit (HOSPITAL_COMMUNITY): Payer: Self-pay

## 2021-04-11 ENCOUNTER — Other Ambulatory Visit: Payer: Self-pay | Admitting: Family Medicine

## 2021-04-13 ENCOUNTER — Other Ambulatory Visit (HOSPITAL_COMMUNITY): Payer: Self-pay

## 2021-04-13 MED ORDER — VALACYCLOVIR HCL 1 G PO TABS
1000.0000 mg | ORAL_TABLET | Freq: Every day | ORAL | 0 refills | Status: DC
  Filled 2021-04-13: qty 90, 90d supply, fill #0

## 2021-04-15 ENCOUNTER — Other Ambulatory Visit (HOSPITAL_COMMUNITY): Payer: Self-pay

## 2021-04-27 NOTE — Telephone Encounter (Signed)
Sent my chart message 04/27/21

## 2021-05-02 ENCOUNTER — Other Ambulatory Visit (HOSPITAL_COMMUNITY): Payer: Self-pay

## 2021-05-02 MED ORDER — VALACYCLOVIR HCL 1 G PO TABS
1000.0000 mg | ORAL_TABLET | Freq: Every day | ORAL | 2 refills | Status: DC
  Filled 2021-05-02 – 2021-07-22 (×2): qty 90, 90d supply, fill #0
  Filled 2021-11-01: qty 90, 90d supply, fill #1
  Filled 2022-02-01: qty 90, 90d supply, fill #2

## 2021-05-25 ENCOUNTER — Other Ambulatory Visit (HOSPITAL_COMMUNITY): Payer: Self-pay | Admitting: Internal Medicine

## 2021-05-25 DIAGNOSIS — Z1231 Encounter for screening mammogram for malignant neoplasm of breast: Secondary | ICD-10-CM

## 2021-06-01 ENCOUNTER — Ambulatory Visit (HOSPITAL_COMMUNITY)
Admission: RE | Admit: 2021-06-01 | Discharge: 2021-06-01 | Disposition: A | Discharge status: Home / Self Care | Payer: No Typology Code available for payment source | Source: Ambulatory Visit | Attending: Internal Medicine | Admitting: Internal Medicine

## 2021-06-01 ENCOUNTER — Other Ambulatory Visit: Payer: Self-pay

## 2021-06-01 DIAGNOSIS — Z1231 Encounter for screening mammogram for malignant neoplasm of breast: Secondary | ICD-10-CM | POA: Diagnosis not present

## 2021-07-22 ENCOUNTER — Other Ambulatory Visit (HOSPITAL_COMMUNITY): Payer: Self-pay

## 2021-07-22 NOTE — Telephone Encounter (Signed)
Sent 2 my chart message to schedule appointment 07/22/21

## 2021-07-28 NOTE — Telephone Encounter (Signed)
No response to my chart messages.

## 2021-10-14 ENCOUNTER — Other Ambulatory Visit (HOSPITAL_COMMUNITY): Payer: Self-pay | Admitting: Family Medicine

## 2021-10-14 ENCOUNTER — Other Ambulatory Visit: Payer: Self-pay | Admitting: Family Medicine

## 2021-10-14 DIAGNOSIS — R140 Abdominal distension (gaseous): Secondary | ICD-10-CM

## 2021-10-24 ENCOUNTER — Encounter (HOSPITAL_COMMUNITY): Payer: Self-pay

## 2021-10-25 ENCOUNTER — Ambulatory Visit (HOSPITAL_COMMUNITY)
Admission: RE | Admit: 2021-10-25 | Discharge: 2021-10-25 | Disposition: A | Discharge status: Home / Self Care | Payer: No Typology Code available for payment source | Source: Ambulatory Visit | Attending: Family Medicine | Admitting: Family Medicine

## 2021-10-25 ENCOUNTER — Encounter (HOSPITAL_COMMUNITY): Payer: Self-pay | Admitting: Radiology

## 2021-10-25 DIAGNOSIS — R140 Abdominal distension (gaseous): Secondary | ICD-10-CM | POA: Insufficient documentation

## 2021-10-25 MED ORDER — IOHEXOL 300 MG/ML  SOLN
100.0000 mL | Freq: Once | INTRAMUSCULAR | Status: AC | PRN
  Administered 2021-10-25: 100 mL via INTRAVENOUS

## 2021-10-26 ENCOUNTER — Ambulatory Visit (HOSPITAL_COMMUNITY): Payer: No Typology Code available for payment source

## 2021-11-01 ENCOUNTER — Encounter (INDEPENDENT_AMBULATORY_CARE_PROVIDER_SITE_OTHER): Payer: Self-pay | Admitting: *Deleted

## 2021-11-01 ENCOUNTER — Other Ambulatory Visit (HOSPITAL_COMMUNITY): Payer: Self-pay

## 2021-11-02 ENCOUNTER — Other Ambulatory Visit (HOSPITAL_COMMUNITY): Payer: Self-pay

## 2022-01-05 ENCOUNTER — Other Ambulatory Visit (HOSPITAL_COMMUNITY): Payer: Self-pay

## 2022-01-05 ENCOUNTER — Encounter (INDEPENDENT_AMBULATORY_CARE_PROVIDER_SITE_OTHER): Payer: Self-pay | Admitting: Gastroenterology

## 2022-01-05 ENCOUNTER — Ambulatory Visit (INDEPENDENT_AMBULATORY_CARE_PROVIDER_SITE_OTHER): Payer: No Typology Code available for payment source | Admitting: Gastroenterology

## 2022-01-05 VITALS — BP 115/75 | HR 80 | Temp 99.5°F | Ht 67.0 in | Wt 199.0 lb

## 2022-01-05 DIAGNOSIS — K921 Melena: Secondary | ICD-10-CM

## 2022-01-05 DIAGNOSIS — K5900 Constipation, unspecified: Secondary | ICD-10-CM

## 2022-01-05 DIAGNOSIS — R12 Heartburn: Secondary | ICD-10-CM | POA: Insufficient documentation

## 2022-01-05 DIAGNOSIS — R140 Abdominal distension (gaseous): Secondary | ICD-10-CM | POA: Insufficient documentation

## 2022-01-05 MED ORDER — PANTOPRAZOLE SODIUM 40 MG PO TBEC
40.0000 mg | DELAYED_RELEASE_TABLET | Freq: Every day | ORAL | 1 refills | Status: DC
  Filled 2022-01-05: qty 60, 60d supply, fill #0
  Filled 2022-03-01: qty 60, 60d supply, fill #1

## 2022-01-05 NOTE — Patient Instructions (Signed)
I Will try to get Get cologuard results from Dr. Durene Cal office  I have sent protonix '40mg'$  to your pharmacy Please take this 30 minutes prior to breakfast Avoid greasy, spicy, fried, citrus foods, and be mindful that caffeine, carbonated drinks, chocolate and alcohol can increase reflux symptoms. Stay upright 2-3 hours after eating, prior to lying down and avoid eating late in the evenings.  I am also providing the Mediterranean diet as this can be helpful in patients with fatty liver, coffee and vitamin E have also been shown to be liver proctective.  Continue to avoid eggs, dairy and try complete avoidance of gluten 2-3 weeks to see if symptoms improve as I suspect that you have a gluten intolerance.  We will discuss possible colonoscopy/upper endoscopy at next visit if symptoms are not improved, please call me if you have any new or worsening symptoms   Follow up 3 months

## 2022-01-05 NOTE — Progress Notes (Unsigned)
Referring Provider: Valentino Nose, FNP Primary Care Physician:  Valentino Nose, FNP Primary GI Physician: new  Chief Complaint  Patient presents with   [facility]    New patient visit. Referred for abdominal bloating. Patient states she is some better since finding out she is allergic to egg whites and has changed her diet. Has cut down on glutean.    HPI:   Danielle Duke is a 56 y.o. female with no pertinent medical history.   Patient presenting today as a new patient for abdominal pain and bloating.   Recent workup with PCP included food allergy panel and CT A/P in April.  Patient reports that in March she saw PCP, states she had eaten cantoulope and wasabi peas and she notes very bad bloating thereafter, she states that abdomen was so bloated that it was painful. She had labs and CT done with  no acute abnormalities, hepatic steatosis and food allergy testing with allergy to egg whites. Celiac testing was inconclusive. She endorses severe heartburn when she eats bread. She is trying to do lower gluten intake. She has tried to cut down on spicy foods as well. Bloating of abdomen is improved since she cut out eggs but still occurs at times. She is using tums multiple times per day some days, although some days if she does not eat a lot she  may not have much issue with heartburn. She is trying to lose weight and eat more baked or grilled foods.  Has very occasional dysphagia. She did take some OTC omeprazole and pepcid at one point, though she felt that bloating started soon after she was taking PPI so she stopped it. She reports that she does have some occasional early satiety but depends on what she has eaten such as rice.   She has hx of hemorrhoids, has seen blood on occasion when wiping. She denies any melena. Has occasional rectal itching.  No weight loss or changes in appetite. Recent nausea with some sinus stuff but none other wise.   She feels that she has trouble with having a good  bowel movement. She has tried greens at recommendation of her PCP with some improvement but she still feels that she has to sit for a long time for stool to come out as she will pass some stool and then will sit and then pass some more later.   NSAID use: ibuprofen for back pain, on occasion Social DG:LOVFIEPP smoker, occasional alcohol  Fam hx: no crc   Last Colonoscopy: has had a cologuard in the past, maybe a year ago, but no colonoscopy Last Endoscopy:  no EGD  Recommendations:    History reviewed. No pertinent past medical history.  Past Surgical History:  Procedure Laterality Date   BACK SURGERY     2021  or 2022    Current Outpatient Medications  Medication Sig Dispense Refill   Biotin 1 MG CAPS Take 1 capsule by mouth daily.     fluticasone (FLONASE) 50 MCG/ACT nasal spray Place 1 spray into both nostrils daily.     Ibuprofen 200 MG CAPS Take by mouth. Takes as needed     loratadine (CLARITIN) 10 MG tablet Take 10 mg by mouth daily.     Multiple Vitamin (MULTIVITAMIN WITH MINERALS) TABS tablet Take 1 tablet by mouth daily.     valACYclovir (VALTREX) 1000 MG tablet TAKE 1 TABLET (1,000 MG TOTAL) BY MOUTH DAILY. 90 tablet 2   No current facility-administered medications for this visit.  Allergies as of 01/05/2022 - Review Complete 01/05/2022  Allergen Reaction Noted   Wellbutrin [bupropion]  08/11/2019    Family History  Problem Relation Age of Onset   Diabetes Father    Hypertension Father     Social History   Socioeconomic History   Marital status: Widowed    Spouse name: Not on file   Number of children: Not on file   Years of education: Not on file   Highest education level: Not on file  Occupational History   Not on file  Tobacco Use   Smoking status: Former    Packs/day: 0.60    Types: Cigarettes    Passive exposure: Past   Smokeless tobacco: Never  Vaping Use   Vaping Use: Never used  Substance and Sexual Activity   Alcohol use: Yes     Comment: rare social   Drug use: No   Sexual activity: Yes    Comment: partner had vasectomy  Other Topics Concern   Not on file  Social History Narrative   Not on file   Social Determinants of Health   Financial Resource Strain: Not on file  Food Insecurity: Not on file  Transportation Needs: Not on file  Physical Activity: Not on file  Stress: Not on file  Social Connections: Not on file    Review of systems General: negative for malaise, night sweats, fever, chills, weight los Neck: Negative for lumps, goiter, pain and significant neck swelling Resp: Negative for cough, wheezing, dyspnea at rest CV: Negative for chest pain, leg swelling, palpitations, orthopnea GI: denies melena, hematochezia, nausea, vomiting, diarrhea, constipation, dysphagia, odyonophagia, early satiety or unintentional weight loss.  MSK: Negative for joint pain or swelling, back pain, and muscle pain. Derm: Negative for itching or rash Psych: Denies depression, anxiety, memory loss, confusion. No homicidal or suicidal ideation.  Heme: Negative for prolonged bleeding, bruising easily, and swollen nodes. Endocrine: Negative for cold or heat intolerance, polyuria, polydipsia and goiter. Neuro: negative for tremor, gait imbalance, syncope and seizures. The remainder of the review of systems is noncontributory.  Physical Exam: BP 115/75 (BP Location: Left Arm, Patient Position: Sitting, Cuff Size: Large)   Pulse 80   Temp 99.5 F (37.5 C) (Oral)   Ht '5\' 7"'$  (1.702 m)   Wt 199 lb (90.3 kg)   LMP 08/26/2014   BMI 31.17 kg/m  General:   Alert and oriented. No distress noted. Pleasant and cooperative.  Head:  Normocephalic and atraumatic. Eyes:  Conjuctiva clear without scleral icterus. Mouth:  Oral mucosa pink and moist. Good dentition. No lesions. Heart: Normal rate and rhythm, s1 and s2 heart sounds present.  Lungs: Clear lung sounds in all lobes. Respirations equal and unlabored. Abdomen:  +BS, soft,  non-tender and non-distended. No rebound or guarding. No HSM or masses noted. Derm: No palmar erythema or jaundice Msk:  Symmetrical without gross deformities. Normal posture. Extremities:  Without edema. Neurologic:  Alert and  oriented x4 Psych:  Alert and cooperative. Normal mood and affect.  Invalid input(s): "6 MONTHS"   ASSESSMENT: Danielle Duke is a 56 y.o. female presenting today    PLAN:  Get cologuard results  2. Protonix '40mg'$  daily with reflux precautions 3.  Mediterranean diet, coffee Vitamin E 4. Avoid gluten, eggs and dairy 5. Consider EGD/Colonoscopy if symptoms not improved at next visit   Follow Up: 3 months  Wendie Diskin L. Alver Sorrow, MSN, APRN, AGNP-C Adult-Gerontology Nurse Practitioner [facility]

## 2022-01-06 ENCOUNTER — Other Ambulatory Visit (HOSPITAL_COMMUNITY): Payer: Self-pay

## 2022-01-08 DIAGNOSIS — K921 Melena: Secondary | ICD-10-CM | POA: Insufficient documentation

## 2022-02-01 ENCOUNTER — Other Ambulatory Visit (HOSPITAL_COMMUNITY): Payer: Self-pay

## 2022-02-06 ENCOUNTER — Other Ambulatory Visit (HOSPITAL_COMMUNITY): Payer: Self-pay

## 2022-03-01 ENCOUNTER — Other Ambulatory Visit (HOSPITAL_COMMUNITY): Payer: Self-pay

## 2022-04-04 ENCOUNTER — Encounter (INDEPENDENT_AMBULATORY_CARE_PROVIDER_SITE_OTHER): Payer: Self-pay | Admitting: Gastroenterology

## 2022-04-04 ENCOUNTER — Other Ambulatory Visit (INDEPENDENT_AMBULATORY_CARE_PROVIDER_SITE_OTHER): Payer: Self-pay

## 2022-04-04 ENCOUNTER — Other Ambulatory Visit (HOSPITAL_COMMUNITY): Payer: Self-pay

## 2022-04-04 ENCOUNTER — Ambulatory Visit (INDEPENDENT_AMBULATORY_CARE_PROVIDER_SITE_OTHER): Payer: No Typology Code available for payment source | Admitting: Gastroenterology

## 2022-04-04 ENCOUNTER — Telehealth (INDEPENDENT_AMBULATORY_CARE_PROVIDER_SITE_OTHER): Payer: Self-pay | Admitting: Gastroenterology

## 2022-04-04 ENCOUNTER — Telehealth (INDEPENDENT_AMBULATORY_CARE_PROVIDER_SITE_OTHER): Payer: Self-pay

## 2022-04-04 ENCOUNTER — Encounter (INDEPENDENT_AMBULATORY_CARE_PROVIDER_SITE_OTHER): Payer: Self-pay

## 2022-04-04 VITALS — BP 115/75 | HR 74 | Temp 98.3°F | Ht 67.0 in | Wt 193.0 lb

## 2022-04-04 DIAGNOSIS — R12 Heartburn: Secondary | ICD-10-CM

## 2022-04-04 DIAGNOSIS — K5900 Constipation, unspecified: Secondary | ICD-10-CM

## 2022-04-04 DIAGNOSIS — K921 Melena: Secondary | ICD-10-CM

## 2022-04-04 MED ORDER — PEG 3350-KCL-NA BICARB-NACL 420 G PO SOLR
4000.0000 mL | ORAL | 0 refills | Status: DC
  Filled 2022-04-04: qty 4000, 1d supply, fill #0

## 2022-04-04 MED ORDER — PANTOPRAZOLE SODIUM 40 MG PO TBEC
40.0000 mg | DELAYED_RELEASE_TABLET | Freq: Every day | ORAL | 3 refills | Status: DC
  Filled 2022-04-04 – 2022-05-01 (×2): qty 90, 90d supply, fill #0
  Filled 2022-08-02: qty 90, 90d supply, fill #1
  Filled 2022-08-07: qty 90, 90d supply, fill #2
  Filled 2022-10-27: qty 90, 90d supply, fill #3

## 2022-04-04 NOTE — Telephone Encounter (Signed)
Patient stated she would take care of her bill tomorrow 04/05/22

## 2022-04-04 NOTE — Progress Notes (Unsigned)
Referring Provider: Valentino Nose, FNP Primary Care Physician:  Valentino Nose, FNP Primary GI Physician: Jenetta Downer  Chief Complaint  Patient presents with   Gastroesophageal Reflux    3 month follow up on GERD, constipation, bloating and abdominal pain. Doing better.    HPI:   Danielle Duke is a 56 y.o. female with past medical history of HSV 2 and HLD.   Patient presenting today for follow up of GERD.  Lat sen 01/05/22, at that time having bloating, abdominal pain, recent CT unremarkable, food allergy testing with allergy to egg whites, celiac inconclusive. Having heartburn with bread, trying to lower gluten intake and avoid spicy foods. Using tums multiple times per day, having occasionl dysphagia. Also with occasional toilet tissue hematochezia, reportedly with cologuard with PCP recently though PCP stated no record of her having this. Also with some constipation.   Recommended possible EGD/colonoscopy if symptoms persisted, start protonix '40mg'$  daily, mediterranean diet/coffee, vitamin E and physical activity for fatty liver noted on CT, avoid gluten eggs, dairy.  Present: Patient reports she did cologuard Maybe 4 years ago but never got results.   Constipation is improved some. TSH in 2021 1.480. she notes that sometimes when she has a BM she feels that she cannot empty her bowels all the way. Stools are much softer than previously, she has made changes to her diet.she has lost approximately 17 lbs with diet and walking.  She has occasional toilet tissue hematochezia at time when she has to strain.  Reports that bloating of abdomen has improved dramatically. She still has some occasional abdominal cramping when she has to have a BM.   GERD is well controlled on protonix '40mg'$  once daily. No breakthrough symptoms, no dysphagia or odynophagia. No nausea or vomiting.   Last Colonoscopy: never Last Endoscopy:  Recommendations:    No past medical history on file.  Past Surgical  History:  Procedure Laterality Date   BACK SURGERY     2021  or 2022    Current Outpatient Medications  Medication Sig Dispense Refill   Biotin 1 MG CAPS Take 1 capsule by mouth daily.     fluticasone (FLONASE) 50 MCG/ACT nasal spray Place 1 spray into both nostrils daily.     Ibuprofen 200 MG CAPS Take by mouth. Takes as needed     loratadine (CLARITIN) 10 MG tablet Take 10 mg by mouth daily.     Multiple Vitamin (MULTIVITAMIN WITH MINERALS) TABS tablet Take 1 tablet by mouth daily.     pantoprazole (PROTONIX) 40 MG tablet Take 1 tablet (40 mg total) by mouth daily. 60 tablet 1   valACYclovir (VALTREX) 1000 MG tablet TAKE 1 TABLET (1,000 MG TOTAL) BY MOUTH DAILY. 90 tablet 2   No current facility-administered medications for this visit.    Allergies as of 04/04/2022 - Review Complete 04/04/2022  Allergen Reaction Noted   Wellbutrin [bupropion]  08/11/2019    Family History  Problem Relation Age of Onset   Diabetes Father    Hypertension Father     Social History   Socioeconomic History   Marital status: Widowed    Spouse name: Not on file   Number of children: Not on file   Years of education: Not on file   Highest education level: Not on file  Occupational History   Not on file  Tobacco Use   Smoking status: Former    Packs/day: 0.60    Types: Cigarettes    Passive exposure: Past   Smokeless  tobacco: Never  Vaping Use   Vaping Use: Never used  Substance and Sexual Activity   Alcohol use: Yes    Comment: rare social   Drug use: No   Sexual activity: Yes    Comment: partner had vasectomy  Other Topics Concern   Not on file  Social History Narrative   Not on file   Social Determinants of Health   Financial Resource Strain: Not on file  Food Insecurity: Not on file  Transportation Needs: Not on file  Physical Activity: Not on file  Stress: Not on file  Social Connections: Not on file    Review of systems General: negative for malaise, night sweats,  fever, chills, weight los Neck: Negative for lumps, goiter, pain and significant neck swelling Resp: Negative for cough, wheezing, dyspnea at rest CV: Negative for chest pain, leg swelling, palpitations, orthopnea GI: denies melena, hematochezia, nausea, vomiting, diarrhea, constipation, dysphagia, odyonophagia, early satiety or unintentional weight loss.  MSK: Negative for joint pain or swelling, back pain, and muscle pain. Derm: Negative for itching or rash Psych: Denies depression, anxiety, memory loss, confusion. No homicidal or suicidal ideation.  Heme: Negative for prolonged bleeding, bruising easily, and swollen nodes. Endocrine: Negative for cold or heat intolerance, polyuria, polydipsia and goiter. Neuro: negative for tremor, gait imbalance, syncope and seizures. The remainder of the review of systems is noncontributory.  Physical Exam: BP 115/75 (BP Location: Left Arm, Patient Position: Sitting, Cuff Size: Large)   Pulse 74   Temp 98.3 F (36.8 C) (Oral)   Ht '5\' 7"'$  (1.702 m)   Wt 193 lb (87.5 kg)   LMP 08/26/2014   BMI 30.23 kg/m  General:   Alert and oriented. No distress noted. Pleasant and cooperative.  Head:  Normocephalic and atraumatic. Eyes:  Conjuctiva clear without scleral icterus. Mouth:  Oral mucosa pink and moist. Good dentition. No lesions. Heart: Normal rate and rhythm, s1 and s2 heart sounds present.  Lungs: Clear lung sounds in all lobes. Respirations equal and unlabored. Abdomen:  +BS, soft, non-tender and non-distended. No rebound or guarding. No HSM or masses noted. Derm: No palmar erythema or jaundice Msk:  Symmetrical without gross deformities. Normal posture. Extremities:  Without edema. Neurologic:  Alert and  oriented x4 Psych:  Alert and cooperative. Normal mood and affect.  Invalid input(s): "6 MONTHS"   ASSESSMENT: Danielle Duke is a 56 y.o. female presenting today    Indications, risks and benefits of procedure discussed in detail  with patient. Patient verbalized understanding and is in agreement to proceed with colonoscopy at this time.    PLAN:  Continue protonix '40mg'$  daily  2.  Schedule colonoscopy-ASA II 3. Continue to avoid trigger foods 4. Continue with medtirranean diet and exercise 5. Avoid straining, Limit toilet time to <5 minutes 6. Consider hemorrhoid banding after colonoscopy  All questions were answered, patient verbalized understanding and is in agreement with plan as outlined above.    Follow Up: 6 months   Gypsy Kellogg L. Alver Sorrow, MSN, APRN, AGNP-C Adult-Gerontology Nurse Practitioner [facility]

## 2022-04-04 NOTE — Patient Instructions (Signed)
-  Continue protonix '40mg'$  daily  - we will get you Scheduled for colonoscopy -Continue to avoid trigger foods(eggs, gluten, dairy) -Continue with medtirranean diet and exercise, congrats on recent weight loss!! -Avoid straining when having BMs, Limit toilet time to <5 minutes, make sure water intake is good -Consider hemorrhoid banding after colonoscopy  Follow up 6 months

## 2022-04-04 NOTE — Telephone Encounter (Signed)
Danielle Duke, CMA  ?

## 2022-04-05 ENCOUNTER — Other Ambulatory Visit (INDEPENDENT_AMBULATORY_CARE_PROVIDER_SITE_OTHER): Payer: Self-pay

## 2022-04-05 ENCOUNTER — Other Ambulatory Visit (HOSPITAL_COMMUNITY): Payer: Self-pay

## 2022-05-01 ENCOUNTER — Other Ambulatory Visit (HOSPITAL_COMMUNITY): Payer: Self-pay

## 2022-05-01 MED ORDER — VALACYCLOVIR HCL 1 G PO TABS
1000.0000 mg | ORAL_TABLET | Freq: Every day | ORAL | 0 refills | Status: DC
  Filled 2022-05-01: qty 90, 90d supply, fill #0

## 2022-05-17 ENCOUNTER — Encounter (HOSPITAL_COMMUNITY): Payer: Self-pay | Admitting: Gastroenterology

## 2022-05-17 ENCOUNTER — Ambulatory Visit (HOSPITAL_BASED_OUTPATIENT_CLINIC_OR_DEPARTMENT_OTHER): Payer: No Typology Code available for payment source | Admitting: Anesthesiology

## 2022-05-17 ENCOUNTER — Ambulatory Visit (HOSPITAL_COMMUNITY): Payer: No Typology Code available for payment source | Admitting: Anesthesiology

## 2022-05-17 ENCOUNTER — Other Ambulatory Visit: Payer: Self-pay

## 2022-05-17 ENCOUNTER — Encounter (HOSPITAL_COMMUNITY)
Admission: RE | Disposition: A | Discharge status: Home / Self Care | Payer: Self-pay | Source: Home / Self Care | Attending: Gastroenterology

## 2022-05-17 ENCOUNTER — Ambulatory Visit (HOSPITAL_COMMUNITY)
Admission: RE | Admit: 2022-05-17 | Discharge: 2022-05-17 | Disposition: A | Discharge status: Home / Self Care | Payer: No Typology Code available for payment source | Attending: Gastroenterology | Admitting: Gastroenterology

## 2022-05-17 DIAGNOSIS — D124 Benign neoplasm of descending colon: Secondary | ICD-10-CM | POA: Diagnosis not present

## 2022-05-17 DIAGNOSIS — D123 Benign neoplasm of transverse colon: Secondary | ICD-10-CM

## 2022-05-17 DIAGNOSIS — K635 Polyp of colon: Secondary | ICD-10-CM | POA: Insufficient documentation

## 2022-05-17 DIAGNOSIS — K219 Gastro-esophageal reflux disease without esophagitis: Secondary | ICD-10-CM | POA: Diagnosis not present

## 2022-05-17 DIAGNOSIS — K5900 Constipation, unspecified: Secondary | ICD-10-CM | POA: Insufficient documentation

## 2022-05-17 DIAGNOSIS — D126 Benign neoplasm of colon, unspecified: Secondary | ICD-10-CM | POA: Insufficient documentation

## 2022-05-17 DIAGNOSIS — K921 Melena: Secondary | ICD-10-CM

## 2022-05-17 DIAGNOSIS — R140 Abdominal distension (gaseous): Secondary | ICD-10-CM | POA: Insufficient documentation

## 2022-05-17 DIAGNOSIS — K625 Hemorrhage of anus and rectum: Secondary | ICD-10-CM | POA: Diagnosis present

## 2022-05-17 DIAGNOSIS — K5730 Diverticulosis of large intestine without perforation or abscess without bleeding: Secondary | ICD-10-CM | POA: Diagnosis not present

## 2022-05-17 DIAGNOSIS — K644 Residual hemorrhoidal skin tags: Secondary | ICD-10-CM | POA: Insufficient documentation

## 2022-05-17 DIAGNOSIS — D125 Benign neoplasm of sigmoid colon: Secondary | ICD-10-CM | POA: Diagnosis not present

## 2022-05-17 DIAGNOSIS — K648 Other hemorrhoids: Secondary | ICD-10-CM | POA: Diagnosis not present

## 2022-05-17 DIAGNOSIS — Z87891 Personal history of nicotine dependence: Secondary | ICD-10-CM | POA: Insufficient documentation

## 2022-05-17 HISTORY — PX: POLYPECTOMY: SHX149

## 2022-05-17 HISTORY — DX: Gastro-esophageal reflux disease without esophagitis: K21.9

## 2022-05-17 HISTORY — PX: COLONOSCOPY WITH PROPOFOL: SHX5780

## 2022-05-17 LAB — HM COLONOSCOPY

## 2022-05-17 SURGERY — COLONOSCOPY WITH PROPOFOL
Anesthesia: General

## 2022-05-17 MED ORDER — PHENYLEPHRINE 80 MCG/ML (10ML) SYRINGE FOR IV PUSH (FOR BLOOD PRESSURE SUPPORT)
PREFILLED_SYRINGE | INTRAVENOUS | Status: AC
  Filled 2022-05-17: qty 10

## 2022-05-17 MED ORDER — PROPOFOL 500 MG/50ML IV EMUL
INTRAVENOUS | Status: DC | PRN
  Administered 2022-05-17: 150 ug/kg/min via INTRAVENOUS

## 2022-05-17 MED ORDER — PROPOFOL 500 MG/50ML IV EMUL
INTRAVENOUS | Status: AC
  Filled 2022-05-17: qty 50

## 2022-05-17 MED ORDER — PROPOFOL 10 MG/ML IV BOLUS
INTRAVENOUS | Status: DC | PRN
  Administered 2022-05-17: 60 mg via INTRAVENOUS

## 2022-05-17 MED ORDER — LACTATED RINGERS IV SOLN: INTRAVENOUS | Status: DC

## 2022-05-17 NOTE — Op Note (Signed)
[facility] Patient Name: Danielle Duke Procedure Date: 05/17/2022 9:18 AM MRN: [id number] Date of Birth: 25-May-1966 Attending MD: Maylon Peppers , , [id number] CSN: [id number] Age: 56 Admit Type: Outpatient Procedure:                Colonoscopy Indications:              Rectal bleeding Providers:                Maylon Peppers, [place] Page Referring MD:              Medicines:                Monitored Anesthesia Care Complications:            No immediate complications. Estimated Blood Loss:     Estimated blood loss: none. Procedure:                Pre-Anesthesia Assessment:                           - Prior to the procedure, a History and Physical                            was performed, and patient medications, allergies                            and sensitivities were reviewed. The patient's                            tolerance of previous anesthesia was reviewed.                           - The risks and benefits of the procedure and the                            sedation options and risks were discussed with the                            patient. All questions were answered and informed                            consent was obtained.                           - ASA Grade Assessment: II - A patient with mild                            systemic disease.                           After obtaining informed consent, the colonoscope                            was passed under direct vision. Throughout the                            procedure, the patient's blood pressure, pulse, and  oxygen saturations were monitored continuously. The                            [id number]-HQ190L ([id number]) scope was introduced through                            the anus and advanced to the the cecum, identified                            by appendiceal orifice and ileocecal valve. The                            colonoscopy was performed without difficulty. The                             patient tolerated the procedure well. The quality                            of the bowel preparation was adequate. Scope In: 9:33:53 AM Scope Out: 10:07:45 AM Scope Withdrawal Time: 0 hours 22 minutes 28 seconds  Total Procedure Duration: 0 hours 33 minutes 52 seconds  Findings:      A 3 mm polyp was found in the transverse colon. The polyp was sessile.       The polyp was removed with a cold snare. Resection and retrieval were       complete.      Two sessile polyps were found in the sigmoid colon and descending colon.       The polyps were 3 to 8 mm in size. These polyps were removed with a cold       snare. Resection and retrieval were complete.      Multiple large-mouthed and small-mouthed diverticula were found in the       sigmoid colon and descending colon.      Non-bleeding internal hemorrhoids were found during retroflexion. The       hemorrhoids were medium-sized.      Non-bleeding external hemorrhoids were found during perianal exam. The       hemorrhoids were medium-sized. Impression:               - One 3 mm polyp in the transverse colon, removed                            with a cold snare. Resected and retrieved.                           - Two 3 to 8 mm polyps in the sigmoid colon and in                            the descending colon, removed with a cold snare.                            Resected and retrieved.                           - Diverticulosis in the sigmoid colon and  in the                            descending colon.                           - Non-bleeding internal hemorrhoids.                           - Non-bleeding external hemorrhoids. Moderate Sedation:      Per Anesthesia Care Recommendation:           - Discharge patient to home (ambulatory).                           - Resume previous diet.                           - Await pathology results.                           - Repeat colonoscopy for surveillance based on                             pathology results.                           - Start taking one capful of Miralax every day,                            uptitrate up to 3 capfuls per day as needed Procedure Code(s):        --- Professional ---                           [phone], Colonoscopy, flexible; with removal of                            tumor(s), polyp(s), or other lesion(s) by snare                            technique Diagnosis Code(s):        --- Professional ---                           D12.3, Benign neoplasm of transverse colon (hepatic                            flexure or splenic flexure)                           D12.5, Benign neoplasm of sigmoid colon                           D12.4, Benign neoplasm of descending colon                           K64.4, Residual hemorrhoidal skin tags  K64.8, Other hemorrhoids                           K62.5, Hemorrhage of anus and rectum                           K57.30, Diverticulosis of large intestine without                            perforation or abscess without bleeding CPT copyright 2022 American Medical Association. All rights reserved. The codes documented in this report are preliminary and upon coder review may  be revised to meet current compliance requirements. Maylon Peppers, MD Maylon Peppers,  05/17/2022 10:14:17 AM This report has been signed electronically. Number of Addenda: 0

## 2022-05-17 NOTE — H&P (Signed)
Danielle Duke is an 56 y.o. female.   Chief Complaint: hematochezia HPI: 56 y/o F with PMH GERD and possible IBS-C, coming for evaluation of hematochezia.  Patient has presented recurrent episodes of constipation and bloating.  Has not presented intermittent episodes of hematochezia.  Never had colonoscopy in the past. Past Medical History:  Diagnosis Date   GERD (gastroesophageal reflux disease)     Past Surgical History:  Procedure Laterality Date   BACK SURGERY     2021  or 2022    Family History  Problem Relation Age of Onset   Diabetes Father    Hypertension Father    Colon cancer Neg Hx    Social History:  reports that she has quit smoking. Her smoking use included cigarettes. She smoked an average of .6 packs per day. She has been exposed to tobacco smoke. She has never used smokeless tobacco. She reports current alcohol use. She reports that she does not use drugs.  Allergies:  Allergies  Allergen Reactions   Eggs Or Egg-Derived Products     bloating   Wellbutrin [Bupropion]     Stuttering and messed up thought process    Medications Prior to Admission  Medication Sig Dispense Refill   Carboxymethylcellul-Glycerin (LUBRICATING EYE DROPS OP) Place 1 drop into both eyes daily as needed (dry eyes).     Coenzyme Q10 (COQ10) 100 MG CAPS Take 100 mg by mouth daily.     fexofenadine (ALLEGRA) 180 MG tablet Take 180 mg by mouth daily.     Ibuprofen 200 MG CAPS Take 400-600 mg by mouth every 6 (six) hours as needed (pain).     Multiple Vitamins-Minerals (ADULT GUMMY PO) Take 2 capsules by mouth daily.     pantoprazole (PROTONIX) 40 MG tablet Take 1 tablet (40 mg total) by mouth daily. 90 tablet 3   polyethylene glycol-electrolytes (TRILYTE) 420 g solution Take 4,000 mLs by mouth as directed. 4000 mL 0   valACYclovir (VALTREX) 1000 MG tablet Take 1 tablet (1,000 mg total) by mouth daily. 90 tablet 0   Menthol-Methyl Salicylate (SALONPAS PAIN RELIEF PATCH EX) Apply 1 patch  topically daily as needed (pain).      No results found for this or any previous visit (from the past 48 hour(s)). No results found.  Review of Systems  Constitutional: Negative.   HENT: Negative.    Eyes: Negative.   Respiratory: Negative.    Cardiovascular: Negative.   Gastrointestinal:  Positive for blood in stool and constipation.  Endocrine: Negative.   Genitourinary: Negative.   Musculoskeletal: Negative.   Skin: Negative.   Allergic/Immunologic: Negative.   Neurological: Negative.   Hematological: Negative.   Psychiatric/Behavioral: Negative.      Blood pressure 121/62, pulse 66, temperature 98.2 F (36.8 C), temperature source Oral, resp. rate 18, height '5\' 7"'$  (1.702 m), weight 85.3 kg, last menstrual period 08/26/2014, SpO2 100 %. Physical Exam  GENERAL: The patient is AO x3, in no acute distress. HEENT: Head is normocephalic and atraumatic. EOMI are intact. Mouth is well hydrated and without lesions. NECK: Supple. No masses LUNGS: Clear to auscultation. No presence of rhonchi/wheezing/rales. Adequate chest expansion HEART: RRR, normal s1 and s2. ABDOMEN: Soft, nontender, no guarding, no peritoneal signs, and nondistended. BS +. No masses. EXTREMITIES: Without any cyanosis, clubbing, rash, lesions or edema. NEUROLOGIC: AOx3, no focal motor deficit. SKIN: no jaundice, no rashes  Assessment/Plan 56 y/o F with PMH GERD and possible IBS-C, coming for evaluation of hematochezia.  We will proceed  with colonoscopy.  Harvel Quale, MD 05/17/2022, 9:20 AM

## 2022-05-17 NOTE — Anesthesia Preprocedure Evaluation (Addendum)
Anesthesia Evaluation  Patient identified by MRN, date of birth, ID band Patient awake    Reviewed: Allergy & Precautions, NPO status , Patient's Chart, lab work & pertinent test results  Airway Mallampati: II  TM Distance: >3 FB Neck ROM: Full    Dental  (+) Dental Advisory Given, Teeth Intact   Pulmonary former smoker,    Pulmonary exam normal breath sounds clear to auscultation       Cardiovascular negative cardio ROS Normal cardiovascular exam Rhythm:Regular Rate:Normal     Neuro/Psych negative neurological ROS  negative psych ROS   GI/Hepatic Neg liver ROS, GERD  Medicated and Controlled,  Endo/Other  negative endocrine ROS  Renal/GU negative Renal ROS  negative genitourinary   Musculoskeletal negative musculoskeletal ROS (+)   Abdominal   Peds negative pediatric ROS (+)  Hematology negative hematology ROS (+)   Anesthesia Other Findings   Reproductive/Obstetrics negative OB ROS                            Anesthesia Physical Anesthesia Plan  ASA: 2  Anesthesia Plan: General   Post-op Pain Management: Minimal or no pain anticipated   Induction: Intravenous  PONV Risk Score and Plan: Propofol infusion  Airway Management Planned: Nasal Cannula and Natural Airway  Additional Equipment:   Intra-op Plan:   Post-operative Plan:   Informed Consent: I have reviewed the patients History and Physical, chart, labs and discussed the procedure including the risks, benefits and alternatives for the proposed anesthesia with the patient or authorized representative who has indicated his/her understanding and acceptance.     Dental advisory given  Plan Discussed with: CRNA and Surgeon  Anesthesia Plan Comments:         Anesthesia Quick Evaluation

## 2022-05-17 NOTE — Discharge Instructions (Signed)
You are being discharged to home.  Resume your previous diet.  We are waiting for your pathology results.  Your physician has recommended a repeat colonoscopy for surveillance based on pathology results. . Start taking one capful of Miralax every day, uptitrate up to 3 capfuls per day as needed 

## 2022-05-17 NOTE — Transfer of Care (Signed)
Immediate Anesthesia Transfer of Care Note  Patient: Danielle Duke  Procedure(s) Performed: COLONOSCOPY WITH PROPOFOL POLYPECTOMY INTESTINAL  Patient Location: PACU  Anesthesia Type:General  Level of Consciousness: awake, alert  and oriented  Airway & Oxygen Therapy: Patient Spontanous Breathing  Post-op Assessment: Report given to RN, Post -op Vital signs reviewed and stable, Patient moving all extremities X 4 and Patient able to stick tongue midline  Post vital signs: Reviewed  Last Vitals:  Vitals Value Taken Time  BP 100/51 05/17/22 1012  Temp 36.6 C 05/17/22 1012  Pulse 61 05/17/22 1012  Resp 14 05/17/22 1012  SpO2 100 % 05/17/22 1012    Last Pain:  Vitals:   05/17/22 1012  TempSrc: Oral  PainSc: 0-No pain      Patients Stated Pain Goal: 8 (58/25/18 9842)  Complications: No notable events documented.

## 2022-05-17 NOTE — Anesthesia Postprocedure Evaluation (Signed)
Anesthesia Post Note  Patient: Danielle Duke  Procedure(s) Performed: COLONOSCOPY WITH PROPOFOL POLYPECTOMY INTESTINAL  Patient location during evaluation: Phase II Anesthesia Type: General Level of consciousness: awake and alert and oriented Pain management: pain level controlled Vital Signs Assessment: post-procedure vital signs reviewed and stable Respiratory status: spontaneous breathing, nonlabored ventilation and respiratory function stable Cardiovascular status: blood pressure returned to baseline and stable Postop Assessment: no apparent nausea or vomiting Anesthetic complications: no   No notable events documented.   Last Vitals:  Vitals:   05/17/22 0911 05/17/22 1012  BP: 121/62 (!) 100/51  Pulse: 66 61  Resp: 18 14  Temp: 36.8 C 36.6 C  SpO2: 100% 100%    Last Pain:  Vitals:   05/17/22 1012  TempSrc: Oral  PainSc: 0-No pain                 Trachelle Low C Varie Machamer

## 2022-05-18 ENCOUNTER — Encounter (INDEPENDENT_AMBULATORY_CARE_PROVIDER_SITE_OTHER): Payer: Self-pay | Admitting: *Deleted

## 2022-05-19 LAB — SURGICAL PATHOLOGY

## 2022-05-24 ENCOUNTER — Encounter (HOSPITAL_COMMUNITY): Payer: Self-pay | Admitting: Gastroenterology

## 2022-08-02 ENCOUNTER — Other Ambulatory Visit: Payer: Self-pay

## 2022-08-02 ENCOUNTER — Other Ambulatory Visit (HOSPITAL_COMMUNITY): Payer: Self-pay

## 2022-08-02 MED ORDER — VALACYCLOVIR HCL 1 G PO TABS
1000.0000 mg | ORAL_TABLET | Freq: Every day | ORAL | 1 refills | Status: DC
  Filled 2022-08-02: qty 90, 90d supply, fill #0
  Filled 2022-10-27: qty 90, 90d supply, fill #1

## 2022-08-03 ENCOUNTER — Other Ambulatory Visit: Payer: Self-pay

## 2022-08-07 ENCOUNTER — Other Ambulatory Visit (HOSPITAL_COMMUNITY): Payer: Self-pay

## 2022-09-26 ENCOUNTER — Ambulatory Visit (INDEPENDENT_AMBULATORY_CARE_PROVIDER_SITE_OTHER): Payer: No Typology Code available for payment source | Admitting: Gastroenterology

## 2022-10-27 ENCOUNTER — Other Ambulatory Visit (HOSPITAL_COMMUNITY): Payer: Self-pay

## 2022-12-20 DIAGNOSIS — L821 Other seborrheic keratosis: Secondary | ICD-10-CM | POA: Diagnosis not present

## 2022-12-20 DIAGNOSIS — D224 Melanocytic nevi of scalp and neck: Secondary | ICD-10-CM | POA: Diagnosis not present

## 2022-12-20 DIAGNOSIS — D485 Neoplasm of uncertain behavior of skin: Secondary | ICD-10-CM | POA: Diagnosis not present

## 2022-12-20 DIAGNOSIS — L570 Actinic keratosis: Secondary | ICD-10-CM | POA: Diagnosis not present

## 2022-12-20 DIAGNOSIS — C44719 Basal cell carcinoma of skin of left lower limb, including hip: Secondary | ICD-10-CM | POA: Diagnosis not present

## 2022-12-20 DIAGNOSIS — L578 Other skin changes due to chronic exposure to nonionizing radiation: Secondary | ICD-10-CM | POA: Diagnosis not present

## 2022-12-20 DIAGNOSIS — C44712 Basal cell carcinoma of skin of right lower limb, including hip: Secondary | ICD-10-CM | POA: Diagnosis not present

## 2022-12-20 DIAGNOSIS — D1801 Hemangioma of skin and subcutaneous tissue: Secondary | ICD-10-CM | POA: Diagnosis not present

## 2022-12-20 DIAGNOSIS — L738 Other specified follicular disorders: Secondary | ICD-10-CM | POA: Diagnosis not present

## 2022-12-23 ENCOUNTER — Other Ambulatory Visit (HOSPITAL_COMMUNITY): Payer: Self-pay

## 2022-12-23 MED ORDER — FLUOROURACIL 5 % EX CREA
1.0000 | TOPICAL_CREAM | Freq: Every day | CUTANEOUS | 0 refills | Status: AC
  Filled 2022-12-23: qty 40, 14d supply, fill #0

## 2022-12-26 ENCOUNTER — Other Ambulatory Visit (HOSPITAL_COMMUNITY): Payer: Self-pay

## 2023-01-04 ENCOUNTER — Encounter (INDEPENDENT_AMBULATORY_CARE_PROVIDER_SITE_OTHER): Payer: Self-pay | Admitting: Gastroenterology

## 2023-01-04 ENCOUNTER — Other Ambulatory Visit: Payer: Self-pay

## 2023-01-04 ENCOUNTER — Other Ambulatory Visit (HOSPITAL_COMMUNITY): Payer: Self-pay

## 2023-01-04 ENCOUNTER — Ambulatory Visit (INDEPENDENT_AMBULATORY_CARE_PROVIDER_SITE_OTHER): Payer: 59 | Admitting: Gastroenterology

## 2023-01-04 VITALS — BP 113/68 | HR 79 | Temp 97.8°F | Ht 67.0 in | Wt 202.0 lb

## 2023-01-04 DIAGNOSIS — K5900 Constipation, unspecified: Secondary | ICD-10-CM | POA: Diagnosis not present

## 2023-01-04 DIAGNOSIS — R12 Heartburn: Secondary | ICD-10-CM | POA: Diagnosis not present

## 2023-01-04 MED ORDER — PANTOPRAZOLE SODIUM 40 MG PO TBEC
40.0000 mg | DELAYED_RELEASE_TABLET | Freq: Every day | ORAL | 3 refills | Status: DC
  Filled 2023-01-04 – 2023-01-30 (×2): qty 90, 90d supply, fill #0
  Filled 2023-05-01: qty 90, 90d supply, fill #1
  Filled 2023-08-13: qty 90, 90d supply, fill #2

## 2023-01-04 NOTE — Progress Notes (Addendum)
Referring Provider: Leone Payor, FNP Primary Care Physician:  Leone Payor, FNP Primary GI Physician:   Chief Complaint  Patient presents with   Follow-up    Patient here today for a follow up visit. Patient says heartburn is under control with Pantoprazole 40 mg once per day. Patient denies any other gi issue.   HPI:   Danielle Duke is a 57 y.o. female with past medical history of HSV2, HLD  Patient presenting today for follow up of GERD and constipation/hemorrhoids   Last seen September 2023, at that time Constipation is improved some. Stools are much softer than previously,  made changes to her diet, down 6 pounds since last visit with implementation of mediterranean diet and walking. occasional toilet tissue hematochezia at time when she has to strain.  bloating of abdomen  improved dramatically. continues to avoid egg whites, is trying to limit gluten and has noted that dairy tends to cause her abdominal bloating/upset so she is avoiding this as well. She still has some occasional abdominal cramping when she has to have a BM. GERD well controlled on protonix 40mg  once daily.  Recommended continue protonix 40mg  daily, colonoscopy, continue current diet, hemorrhoid banding considerations  Present:  Patient states she is doing well. Is trying to avoid certain trigger foods. Protonix Is working well. No breakthrough symptoms. Denies dysphagia.  Has occasional bloating but much better than previously, usually notes it when she has consumed something like dairy which she tries to avoid. No nausea, vomiting, no rectal bleeding, melena. Is doing a new workout program and trying to lose some weight. She is eating a lot of fruit and doing miralax which has helped with her constipation and hemorrhoids though she still sometimes feels she does not empty out well.   Last Colonoscopy: -05/2022 One 3 mm polyp in the transverse colon, removed                            with a cold snare.  Resected and retrieved.                           - Two 3 to 8 mm polyps in the sigmoid colon and in                            the descending colon, removed with a cold snare.                            Resected and retrieved.                           - Diverticulosis in the sigmoid colon and in the                            descending colon.                           - Non-bleeding internal hemorrhoids.                           - Non-bleeding external hemorrhoids. (2TAs, 1 HPP)  Last Endoscopy: never  Recommendations:  Repeat colonoscopy 7 years   Past Medical History:  Diagnosis Date   GERD (gastroesophageal reflux disease)     Past Surgical History:  Procedure Laterality Date   BACK SURGERY     2021  or 2022   COLONOSCOPY WITH PROPOFOL N/A 05/17/2022   Procedure: COLONOSCOPY WITH PROPOFOL;  Surgeon: Dolores Frame, MD;  [facility]: [facility];  Service: Gastroenterology;  Laterality: N/A;  1015 ASA 2   POLYPECTOMY  05/17/2022   Procedure: POLYPECTOMY INTESTINAL;  Surgeon: Dolores Frame, MD;  Location: [facility];  Service: Gastroenterology;;    Current Outpatient Medications  Medication Sig Dispense Refill   Carboxymethylcellul-Glycerin (LUBRICATING EYE DROPS OP) Place 1 drop into both eyes daily as needed (dry eyes).     Coenzyme Q10 (COQ10) 100 MG CAPS Take 100 mg by mouth daily.     fexofenadine (ALLEGRA) 180 MG tablet Take 180 mg by mouth daily.     Ibuprofen 200 MG CAPS Take 400-600 mg by mouth every 6 (six) hours as needed (pain).     Menthol-Methyl Salicylate (SALONPAS PAIN RELIEF PATCH EX) Apply 1 patch topically daily as needed (pain).     Multiple Vitamins-Minerals (ADULT GUMMY PO) Take 2 capsules by mouth daily.     pantoprazole (PROTONIX) 40 MG tablet Take 1 tablet (40 mg total) by mouth daily. 90 tablet 3   valACYclovir (VALTREX) 1000 MG tablet Take 1 tablet (1,000 mg total) by mouth daily. 90 tablet 1   fluorouracil (EFUDEX) 5 %  cream Apply 1 Application topically daily for 14 days. (Patient not taking: Reported on 01/04/2023) 40 g 0   No current facility-administered medications for this visit.    Allergies as of 01/04/2023 - Review Complete 01/04/2023  Allergen Reaction Noted   Egg-derived products  05/17/2022   Wellbutrin [bupropion]  08/11/2019    Family History  Problem Relation Age of Onset   Diabetes Father    Hypertension Father    Colon cancer Neg Hx     Social History   Socioeconomic History   Marital status: Widowed    Spouse name: Not on file   Number of children: Not on file   Years of education: Not on file   Highest education level: Not on file  Occupational History   Not on file  Tobacco Use   Smoking status: Former    Packs/day: .6    Types: Cigarettes    Passive exposure: Past   Smokeless tobacco: Never  Vaping Use   Vaping Use: Never used  Substance and Sexual Activity   Alcohol use: Yes    Comment: rare social   Drug use: No   Sexual activity: Yes    Comment: partner had vasectomy  Other Topics Concern   Not on file  Social History Narrative   Not on file   Social Determinants of Health   Financial Resource Strain: Not on file  Food Insecurity: Not on file  Transportation Needs: Not on file  Physical Activity: Not on file  Stress: Not on file  Social Connections: Not on file    Review of systems General: negative for malaise, night sweats, fever, chills, weight loss Neck: Negative for lumps, goiter, pain and significant neck swelling Resp: Negative for cough, wheezing, dyspnea at rest CV: Negative for chest pain, leg swelling, palpitations, orthopnea GI: denies melena, hematochezia, nausea, vomiting, diarrhea, constipation, dysphagia, odyonophagia, early satiety or unintentional weight loss. +sensation of incomplete bowel emptying +hemorrhoids MSK: Negative for joint pain or swelling, back pain, and muscle pain. Derm: Negative for itching or  rash Psych:  Denies depression, anxiety, memory loss, confusion. No homicidal or suicidal ideation.  Heme: Negative for prolonged bleeding, bruising easily, and swollen nodes. Endocrine: Negative for cold or heat intolerance, polyuria, polydipsia and goiter. Neuro: negative for tremor, gait imbalance, syncope and seizures. The remainder of the review of systems is noncontributory.  Physical Exam: BP 113/68 (BP Location: Right Arm, Patient Position: Sitting, Cuff Size: Large)   Pulse 79   Temp 97.8 F (36.6 C) (Temporal)   Ht 5\' 7"  (1.702 m)   Wt 202 lb (91.6 kg)   LMP 08/26/2014   BMI 31.64 kg/m  General:   Alert and oriented. No distress noted. Pleasant and cooperative.  Head:  Normocephalic and atraumatic. Eyes:  Conjuctiva clear without scleral icterus. Mouth:  Oral mucosa pink and moist. Good dentition. No lesions. Heart: Normal rate and rhythm, s1 and s2 heart sounds present.  Lungs: Clear lung sounds in all lobes. Respirations equal and unlabored. Abdomen:  +BS, soft, non-tender and non-distended. No rebound or guarding. No HSM or masses noted. Derm: No palmar erythema or jaundice Msk:  Symmetrical without gross deformities. Normal posture. Extremities:  Without edema. Neurologic:  Alert and  oriented x4 Psych:  Alert and cooperative. Normal mood and affect.  Invalid input(s): "6 MONTHS"   ASSESSMENT: VIRTUE KLINT is a 57 y.o. female presenting today for follow up of GERD, constipation/hemorrhoids   GERD well-managed on Protonix 40 mg once daily, no breakthrough symptoms, denies dysphagia. Will continue with current PPI regimen and good reflux precautions  Constipation is improved with use of MiraLAX, she has had no further rectal bleeding however she does still note sensation of hemorrhoids at times, she had internal and external hemorrhoids noted on recent colonoscopy.  We discussed hemorrhoid banding, I will provide a brochure and she will let me know when she is ready to schedule  this.  Should continue with use of MiraLAX, good water intake, diet high in fruits and veggies, avoid straining and limit toilet time to less than 5 minutes.   PLAN:  Continue protonix 40mg  daily 2. Continue with dietary changes, high water, fruit, veggie intake  3. Hemorrhoid banding brochure-pt to let me know when she wants to schedule  4. Avoid straining, limit toilet time  5. Continue miralax   All questions were answered, patient verbalized understanding and is in agreement with plan as outlined above.    Follow Up: 1 year  Danielle Vandergrift L. Jeanmarie Hubert, MSN, APRN, AGNP-C Adult-Gerontology Nurse Practitioner [facility]  I have reviewed the note and agree with the APP's assessment as described in this progress note  Can discuss possibility of lowering PPI in next appointment or if interested in TIF for GERD.  Katrinka Blazing, MD Gastroenterology and Hepatology [facility]

## 2023-01-04 NOTE — Patient Instructions (Signed)
Continue protonix 40mg  daily  Continue with dietary changes and exercise  I have provided a Hemorrhoid banding brochure, let me know if you are interested in this  Continue with good water intake, diet high in fruits and veggies, Avoid straining, limit toilet times  Follow up 1 year  It was a pleasure to see you today. I want to create trusting relationships with patients and provide genuine, compassionate, and quality care. I truly value your feedback! please be on the lookout for a survey regarding your visit with me today. I appreciate your input about our visit and your time in completing this!    Girl Schissler L. Jeanmarie Hubert, MSN, APRN, AGNP-C Adult-Gerontology Nurse Practitioner [facility] Gastroenterology at [facility]

## 2023-01-25 DIAGNOSIS — C44719 Basal cell carcinoma of skin of left lower limb, including hip: Secondary | ICD-10-CM | POA: Diagnosis not present

## 2023-01-25 DIAGNOSIS — C44712 Basal cell carcinoma of skin of right lower limb, including hip: Secondary | ICD-10-CM | POA: Diagnosis not present

## 2023-01-30 ENCOUNTER — Other Ambulatory Visit: Payer: Self-pay

## 2023-01-30 ENCOUNTER — Other Ambulatory Visit (HOSPITAL_COMMUNITY): Payer: Self-pay

## 2023-01-31 ENCOUNTER — Other Ambulatory Visit (HOSPITAL_COMMUNITY): Payer: Self-pay

## 2023-02-12 ENCOUNTER — Other Ambulatory Visit (HOSPITAL_COMMUNITY): Payer: Self-pay

## 2023-02-12 MED ORDER — VALACYCLOVIR HCL 1 G PO TABS
1000.0000 mg | ORAL_TABLET | Freq: Every day | ORAL | 2 refills | Status: AC
  Filled 2023-02-12: qty 90, 90d supply, fill #0
  Filled 2023-05-05: qty 90, 90d supply, fill #1
  Filled 2023-08-13 – 2023-08-17 (×2): qty 90, 90d supply, fill #2

## 2023-03-16 DIAGNOSIS — R030 Elevated blood-pressure reading, without diagnosis of hypertension: Secondary | ICD-10-CM | POA: Diagnosis not present

## 2023-03-22 DIAGNOSIS — Z0001 Encounter for general adult medical examination with abnormal findings: Secondary | ICD-10-CM | POA: Diagnosis not present

## 2023-03-22 DIAGNOSIS — Z87891 Personal history of nicotine dependence: Secondary | ICD-10-CM | POA: Diagnosis not present

## 2023-03-22 DIAGNOSIS — G8929 Other chronic pain: Secondary | ICD-10-CM | POA: Diagnosis not present

## 2023-03-22 DIAGNOSIS — E875 Hyperkalemia: Secondary | ICD-10-CM | POA: Diagnosis not present

## 2023-03-22 DIAGNOSIS — E782 Mixed hyperlipidemia: Secondary | ICD-10-CM | POA: Diagnosis not present

## 2023-03-22 DIAGNOSIS — Z01419 Encounter for gynecological examination (general) (routine) without abnormal findings: Secondary | ICD-10-CM | POA: Diagnosis not present

## 2023-03-22 DIAGNOSIS — Z0000 Encounter for general adult medical examination without abnormal findings: Secondary | ICD-10-CM | POA: Diagnosis not present

## 2023-03-22 DIAGNOSIS — M545 Low back pain, unspecified: Secondary | ICD-10-CM | POA: Diagnosis not present

## 2023-03-22 DIAGNOSIS — E669 Obesity, unspecified: Secondary | ICD-10-CM | POA: Diagnosis not present

## 2023-03-22 DIAGNOSIS — R030 Elevated blood-pressure reading, without diagnosis of hypertension: Secondary | ICD-10-CM | POA: Diagnosis not present

## 2023-05-01 ENCOUNTER — Other Ambulatory Visit (HOSPITAL_COMMUNITY): Payer: Self-pay

## 2023-05-05 ENCOUNTER — Other Ambulatory Visit (HOSPITAL_COMMUNITY): Payer: Self-pay

## 2023-08-13 ENCOUNTER — Other Ambulatory Visit (HOSPITAL_COMMUNITY): Payer: Self-pay

## 2023-08-13 ENCOUNTER — Other Ambulatory Visit: Payer: Self-pay

## 2023-08-16 ENCOUNTER — Other Ambulatory Visit: Payer: Self-pay

## 2023-08-17 ENCOUNTER — Other Ambulatory Visit: Payer: Self-pay

## 2023-08-17 ENCOUNTER — Other Ambulatory Visit (HOSPITAL_COMMUNITY): Payer: Self-pay

## 2023-10-20 IMAGING — MG MM DIGITAL SCREENING BILAT W/ TOMO AND CAD
8 series · 9 of 24 positions shown · non-contrast
Comparison: Previous exam(s).

CLINICAL DATA: Screening.

EXAM:
DIGITAL SCREENING BILATERAL MAMMOGRAM WITH TOMOSYNTHESIS AND CAD
TECHNIQUE: Bilateral screening digital craniocaudal and mediolateral oblique
mammograms were obtained. Bilateral screening digital breast
tomosynthesis was performed. The images were evaluated with
computer-aided detection.

[R MLO synth-2D]
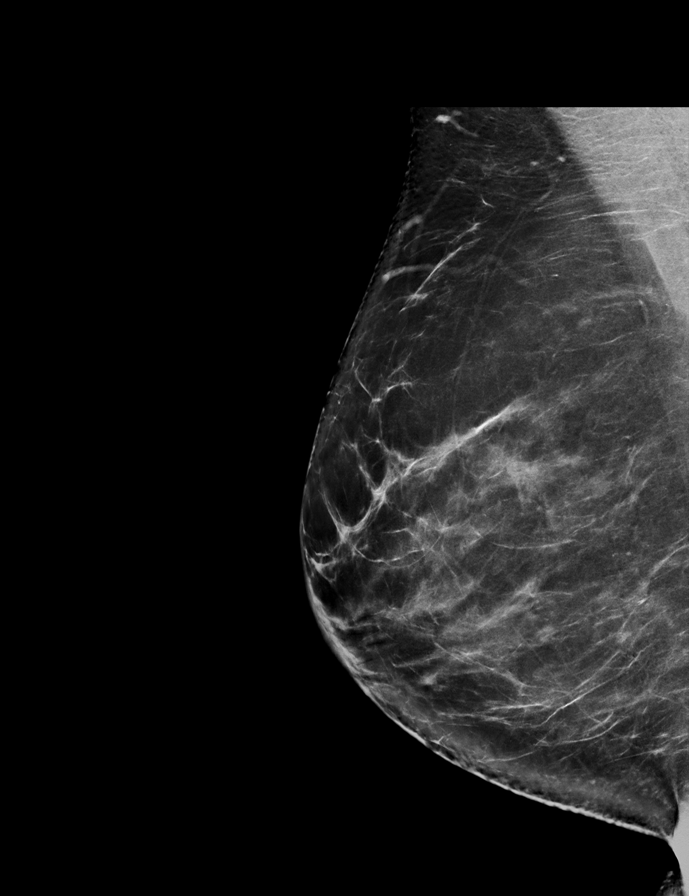

[R CC synth-2D]
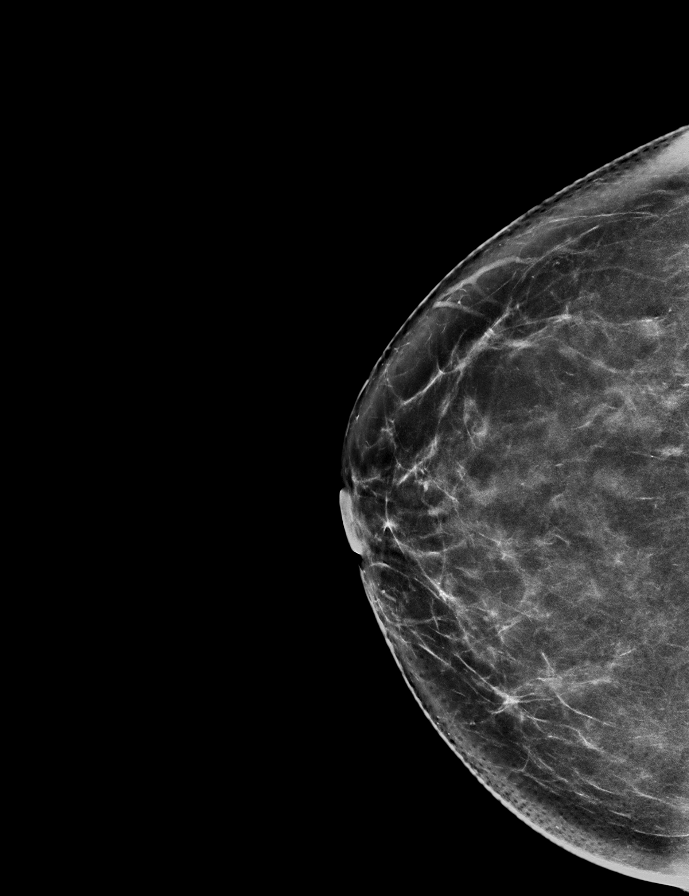

[L CC synth-2D]
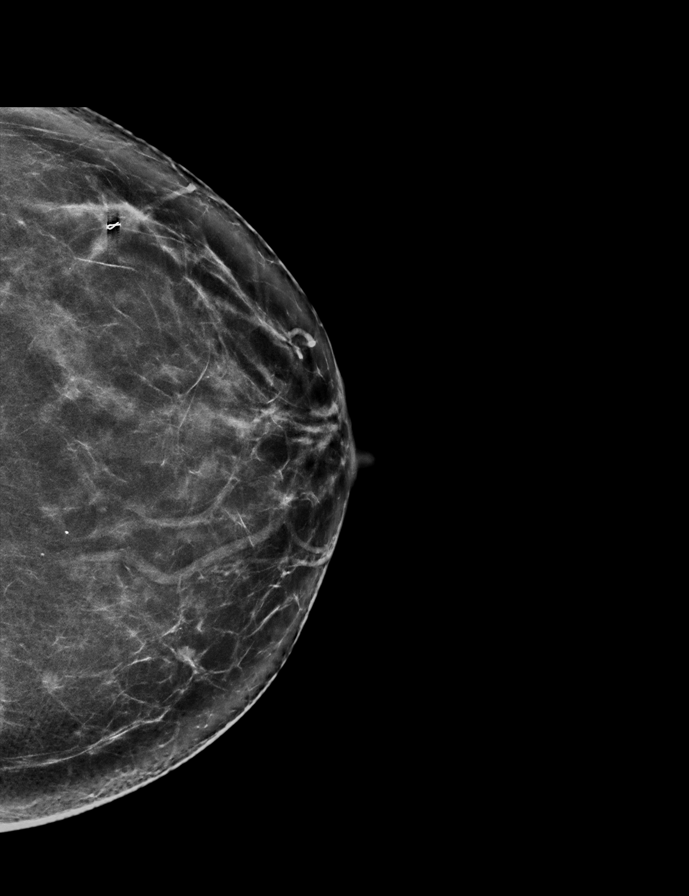

[L MLO synth-2D]
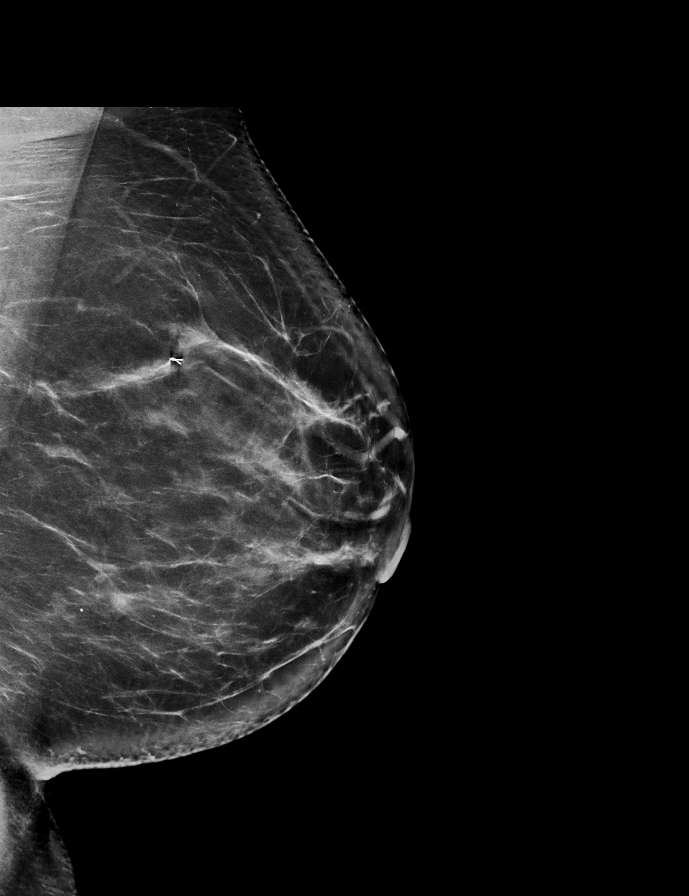

[R MLO tomo · 2 of 84 frames shown]
[frame 28/84]
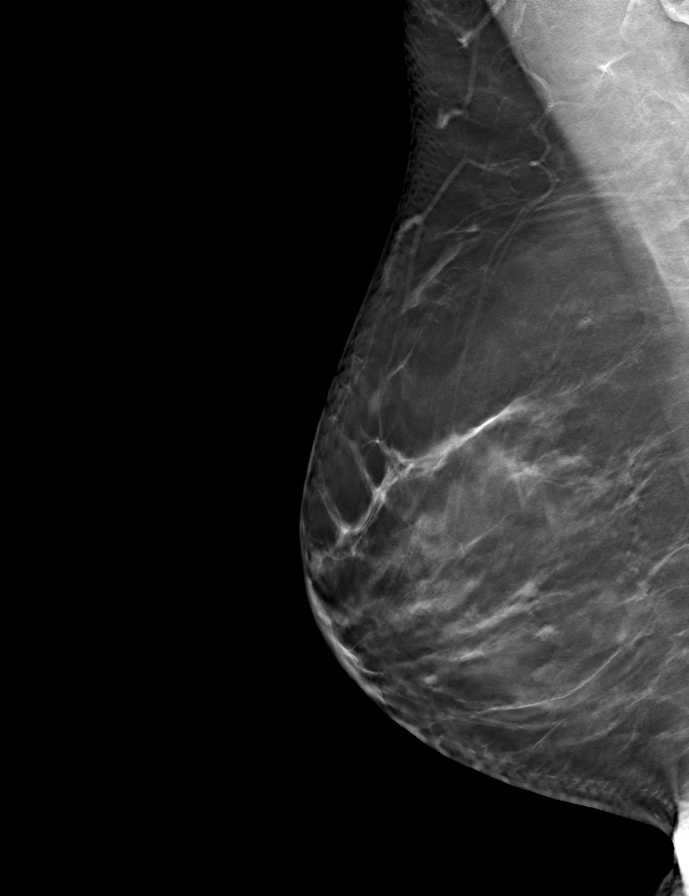
[frame 43/84]
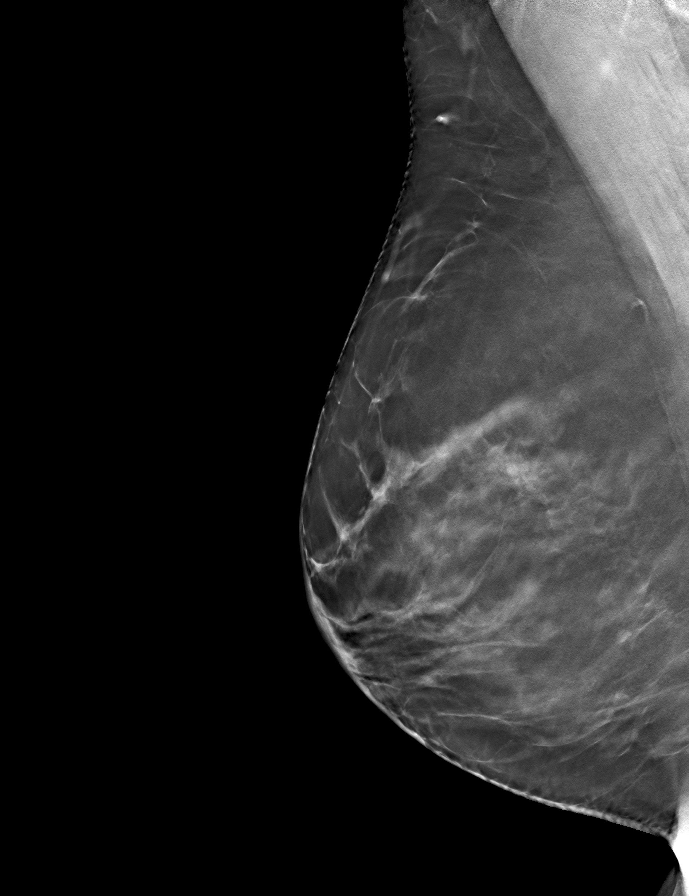

[R CC tomo · tomo slice 41/80.0]
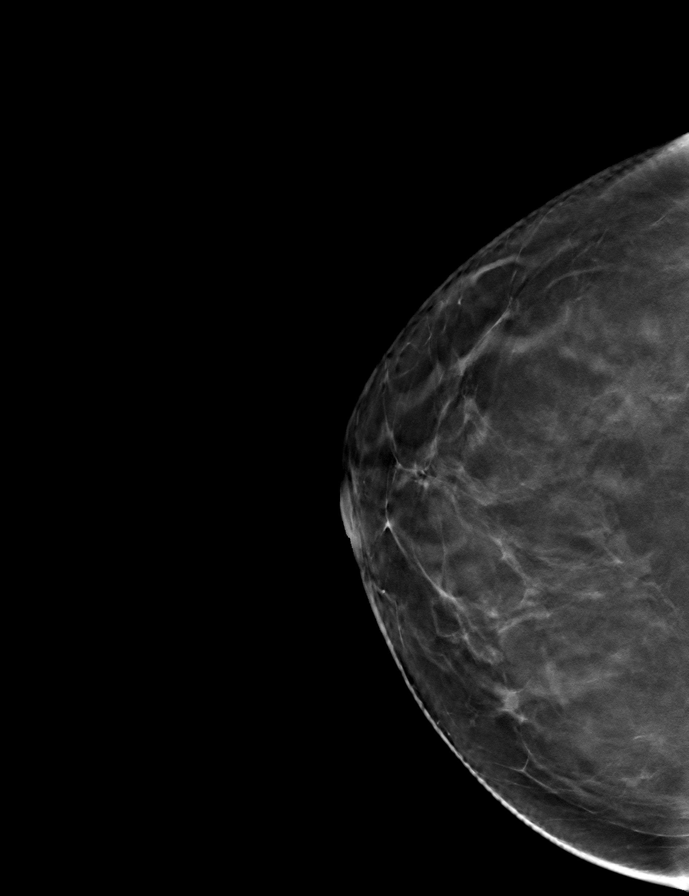

[L CC tomo · tomo slice 39/78.0]
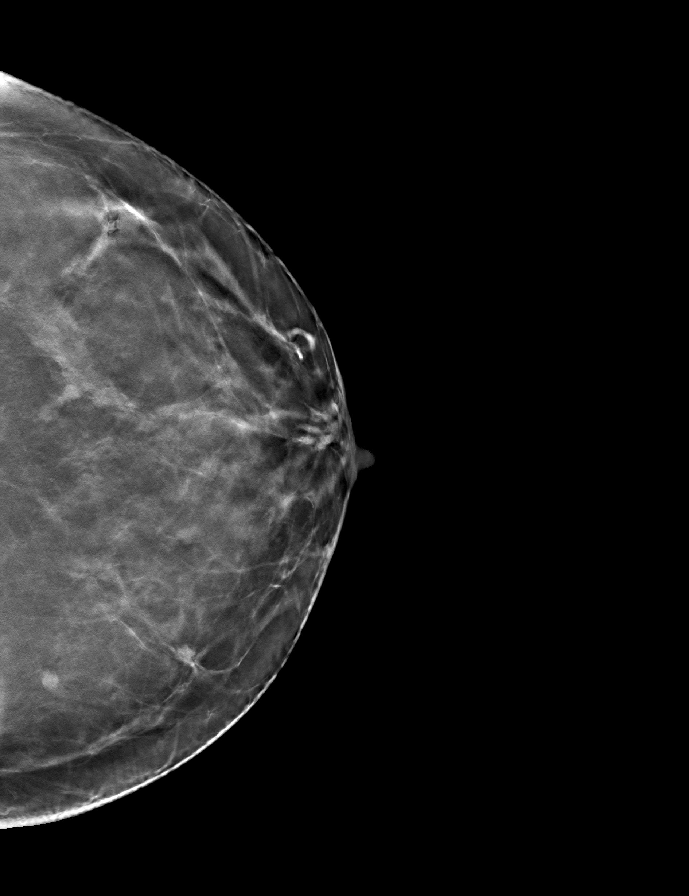

[L MLO tomo · tomo slice 44/87.0]
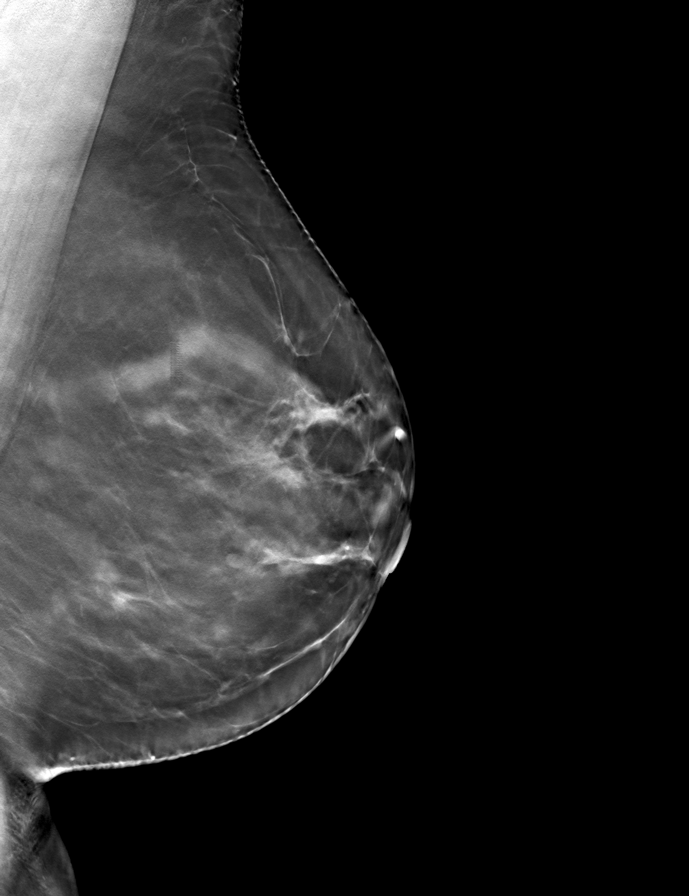

[9 of 24 positions shown; findings below may reference images not displayed]

ACR Breast Density Category b: There are scattered areas of
fibroglandular density.
FINDINGS: There are no findings suspicious for malignancy.
IMPRESSION: No mammographic evidence of malignancy. A result letter of this
screening mammogram will be mailed directly to the patient.

RECOMMENDATION:
Screening mammogram in one year. (Code:51-O-LD2)

BI-RADS CATEGORY  1: Negative.

## 2024-01-07 ENCOUNTER — Ambulatory Visit (INDEPENDENT_AMBULATORY_CARE_PROVIDER_SITE_OTHER): Payer: 59 | Admitting: Gastroenterology

## 2024-01-28 ENCOUNTER — Other Ambulatory Visit (HOSPITAL_COMMUNITY): Payer: Self-pay

## 2024-01-28 ENCOUNTER — Other Ambulatory Visit (INDEPENDENT_AMBULATORY_CARE_PROVIDER_SITE_OTHER): Payer: Self-pay | Admitting: Gastroenterology

## 2024-01-29 ENCOUNTER — Telehealth (INDEPENDENT_AMBULATORY_CARE_PROVIDER_SITE_OTHER): Payer: Self-pay

## 2024-01-29 ENCOUNTER — Other Ambulatory Visit (HOSPITAL_COMMUNITY): Payer: Self-pay

## 2024-01-29 ENCOUNTER — Encounter (HOSPITAL_COMMUNITY): Payer: Self-pay

## 2024-01-29 DIAGNOSIS — D1801 Hemangioma of skin and subcutaneous tissue: Secondary | ICD-10-CM | POA: Diagnosis not present

## 2024-01-29 DIAGNOSIS — L570 Actinic keratosis: Secondary | ICD-10-CM | POA: Diagnosis not present

## 2024-01-29 DIAGNOSIS — L821 Other seborrheic keratosis: Secondary | ICD-10-CM | POA: Diagnosis not present

## 2024-01-29 DIAGNOSIS — L814 Other melanin hyperpigmentation: Secondary | ICD-10-CM | POA: Diagnosis not present

## 2024-01-29 DIAGNOSIS — D224 Melanocytic nevi of scalp and neck: Secondary | ICD-10-CM | POA: Diagnosis not present

## 2024-01-29 DIAGNOSIS — Z85828 Personal history of other malignant neoplasm of skin: Secondary | ICD-10-CM | POA: Diagnosis not present

## 2024-01-29 NOTE — Telephone Encounter (Signed)
 Patient calling to find out why we denied her PPI Protonix . Last seen 01/04/2023, and cancelled the follow up appointment that was scheduled for 01/07/2024 for [facility]. I have called and left a message that she will have to be seen for a visit soon. I would call in a few to last until the upcoming appointment. I asked that she please return call so I can ask which pharmacy she wants to use and let her know in order for future refills she has to be seen with in a year.

## 2024-01-30 ENCOUNTER — Other Ambulatory Visit (HOSPITAL_COMMUNITY): Payer: Self-pay

## 2024-01-30 ENCOUNTER — Other Ambulatory Visit (INDEPENDENT_AMBULATORY_CARE_PROVIDER_SITE_OTHER): Payer: Self-pay

## 2024-01-30 DIAGNOSIS — R12 Heartburn: Secondary | ICD-10-CM

## 2024-01-30 MED ORDER — PANTOPRAZOLE SODIUM 40 MG PO TBEC
40.0000 mg | DELAYED_RELEASE_TABLET | Freq: Every day | ORAL | 0 refills | Status: DC
  Filled 2024-01-30: qty 90, 90d supply, fill #0

## 2024-02-11 ENCOUNTER — Other Ambulatory Visit (HOSPITAL_COMMUNITY): Payer: Self-pay

## 2024-03-04 ENCOUNTER — Ambulatory Visit (INDEPENDENT_AMBULATORY_CARE_PROVIDER_SITE_OTHER): Admitting: Gastroenterology

## 2024-03-04 ENCOUNTER — Encounter (INDEPENDENT_AMBULATORY_CARE_PROVIDER_SITE_OTHER): Payer: Self-pay | Admitting: Gastroenterology

## 2024-03-04 VITALS — BP 115/72 | HR 80 | Temp 97.5°F | Ht 67.0 in | Wt 205.3 lb

## 2024-03-04 DIAGNOSIS — R12 Heartburn: Secondary | ICD-10-CM

## 2024-03-04 DIAGNOSIS — K5900 Constipation, unspecified: Secondary | ICD-10-CM

## 2024-03-04 DIAGNOSIS — K219 Gastro-esophageal reflux disease without esophagitis: Secondary | ICD-10-CM

## 2024-03-04 NOTE — Progress Notes (Signed)
 Referring Provider: Aura Portal, FNP Primary Care Physician:  Shona Norleen PEDLAR, MD Primary GI Physician: Dr. Eartha   Chief Complaint  Patient presents with   Follow-up    Patient here today for a follow up on Heartburn. She says she is taking pantoprazole  40 mg once per day and this is working well for her. Denies any other current gi issues.   HPI:   Danielle Duke is a 58 y.o. female with past medical history of HSV2, HLD, GERD  Patient presenting today for:  Follow up of GERD and constipation  Last seen June 2024, at that time doing well, avoiding trigger foods. Gerd well controlled on protonix  40mg  daily. Has occasional bloating. Doing miralax for constipation  Recommended continue protonix  40mg  daily, dietary changes, continue miralax, hemorrhoid banding brochure provided   Present:  GERD well controlled on protonix  40mg . Notes she has missed a dose and had breakthrough symptoms. No dysphagia or odynophagia. Feels she does really well on protonix .  Still with some constipation and harder stool balls. Doing miralax PRN. She has a BM daily but sometimes stools are harder depending what she eats. Tries to do about 40+ oz of water per day. No rectal bleeding or melena. Denies abdominal pain.   She is trying to change her diet, doing more protein and walking more to help manage her weight.   Last Colonoscopy: -05/2022 One 3 mm polyp in the transverse colon, removed                            with a cold snare. Resected and retrieved.                           - Two 3 to 8 mm polyps in the sigmoid colon and in                            the descending colon, removed with a cold snare.                            Resected and retrieved.                           - Diverticulosis in the sigmoid colon and in the                            descending colon.                           - Non-bleeding internal hemorrhoids.                           - Non-bleeding external  hemorrhoids. (2TAs, 1 HPP)  Last Endoscopy: never   Recommendations:  Repeat colonoscopy 7 years    Filed Weights   03/04/24 0810  Weight: 205 lb 4.8 oz (93.1 kg)     Past Medical History:  Diagnosis Date   GERD (gastroesophageal reflux disease)     Past Surgical History:  Procedure Laterality Date   BACK SURGERY     2021  or 2022   COLONOSCOPY WITH PROPOFOL  N/A 05/17/2022   Procedure: COLONOSCOPY WITH PROPOFOL ;  Surgeon: Eartha Flavors,  Toribio, MD;  Location: [facility];  Service: Gastroenterology;  Laterality: N/A;  1015 ASA 2   POLYPECTOMY  05/17/2022   Procedure: POLYPECTOMY INTESTINAL;  Surgeon: Eartha Angelia Toribio, MD;  Location: [facility];  Service: Gastroenterology;;    Current Outpatient Medications  Medication Sig Dispense Refill   Carboxymethylcellul-Glycerin (LUBRICATING EYE DROPS OP) Place 1 drop into both eyes daily as needed (dry eyes).     Coenzyme Q10 (COQ10) 100 MG CAPS Take 100 mg by mouth daily.     Ibuprofen  200 MG CAPS Take 400-600 mg by mouth every 6 (six) hours as needed (pain).     loratadine (CLARITIN) 10 MG tablet Take 10 mg by mouth daily.     Menthol-Methyl Salicylate (SALONPAS PAIN RELIEF PATCH EX) Apply 1 patch topically daily as needed (pain).     Multiple Vitamins-Minerals (ADULT GUMMY PO) Take 2 capsules by mouth daily.     pantoprazole  (PROTONIX ) 40 MG tablet Take 1 tablet (40 mg total) by mouth daily. 90 tablet 0   valACYclovir  (VALTREX ) 1000 MG tablet Take 1 tablet (1,000 mg total) by mouth daily. (Patient taking differently: Take 1,000 mg by mouth as needed.) 90 tablet 2   No current facility-administered medications for this visit.    Allergies as of 03/04/2024 - Review Complete 03/04/2024  Allergen Reaction Noted   Egg-derived products  05/17/2022   Wellbutrin  [bupropion ]  08/11/2019    Social History   Socioeconomic History   Marital status: Widowed    Spouse name: Not on file   Number of children: Not on file    Years of education: Not on file   Highest education level: Not on file  Occupational History   Not on file  Tobacco Use   Smoking status: Former    Current packs/day: 0.60    Types: Cigarettes    Passive exposure: Past   Smokeless tobacco: Never  Vaping Use   Vaping status: Never Used  Substance and Sexual Activity   Alcohol use: Yes    Comment: rare social   Drug use: No   Sexual activity: Yes    Comment: partner had vasectomy  Other Topics Concern   Not on file  Social History Narrative   Not on file   Social Drivers of Health   Financial Resource Strain: Not on file  Food Insecurity: Not on file  Transportation Needs: Not on file  Physical Activity: Sufficiently Active (10/02/2023)   Received from [facility]   Exercise Vital Sign    On average, how many days per week do you engage in moderate to strenuous exercise (like a brisk walk)?: 5 days    On average, how many minutes do you engage in exercise at this level?: 30 min  Stress: Not on file  Social Connections: Not on file    Review of systems General: negative for malaise, night sweats, fever, chills, weight loss Neck: Negative for lumps, goiter, pain and significant neck swelling Resp: Negative for cough, wheezing, dyspnea at rest CV: Negative for chest pain, leg swelling, palpitations, orthopnea GI: denies melena, hematochezia, nausea, vomiting, diarrhea, dysphagia, odyonophagia, early satiety or unintentional weight loss. +constipation  MSK: Negative for joint pain or swelling, back pain, and muscle pain. Derm: Negative for itching or rash Psych: Denies depression, anxiety, memory loss, confusion. No homicidal or suicidal ideation.  Heme: Negative for prolonged bleeding, bruising easily, and swollen nodes. Endocrine: Negative for cold or heat intolerance, polyuria, polydipsia and goiter. Neuro: negative for tremor,  gait imbalance, syncope and seizures. The remainder of the review of systems  is noncontributory.  Physical Exam: BP 115/72 (BP Location: Left Arm, Patient Position: Sitting, Cuff Size: Large)   Pulse 80   Temp (!) 97.5 F (36.4 C) (Temporal)   Ht 5' 7 (1.702 m)   Wt 205 lb 4.8 oz (93.1 kg)   LMP 08/26/2014   BMI 32.15 kg/m  General:   Alert and oriented. No distress noted. Pleasant and cooperative.  Head:  Normocephalic and atraumatic. Eyes:  Conjuctiva clear without scleral icterus. Mouth:  Oral mucosa pink and moist. Good dentition. No lesions. Heart: Normal rate and rhythm, s1 and s2 heart sounds present.  Lungs: Clear lung sounds in all lobes. Respirations equal and unlabored. Abdomen:  +BS, soft, non-tender and non-distended. No rebound or guarding. No HSM or masses noted. Derm: No palmar erythema or jaundice Neurologic:  Alert and  oriented x4 Psych:  Alert and cooperative. Normal mood and affect.  Invalid input(s): 6 MONTHS   ASSESSMENT: Danielle Duke is a 58 y.o. female presenting today for follow up of GERD and Constipation  GERD: well managed on protonix  40mg  daily. Notes she may have symptoms sometimes when missing a dose though not always. We did discuss trying to decrease PPI to every other day and see how she does which she is willing to do, though if she has more breakthrough will need to resume daily dosing.   Constipation: having a BM daily though sometimes with harder stool balls, trying to drink a good amount of water, using miralax PRN. Encouraged to continue with good water intake, diet high in fruits, veggies, whole grains and can try metamucil BID to help keep bowels moving regularly as well.    PLAN:  -try Protonix  40mg  every other day, resume daily dosing if having symptoms  -Increase water intake, aim for atleast 64 oz per day -Increase fruits, veggies and whole grains, kiwi and prunes are especially good for constipation -continue miralax PRN -Metamucil BID -good reflux precautions   All questions were answered,  patient verbalized understanding and is in agreement with plan as outlined above.    Follow Up: 1 year   Amee Boothe L. Mariette, MSN, APRN, AGNP-C Adult-Gerontology Nurse Practitioner [facility]  I have reviewed the note and agree with the APP's assessment as described in this progress note  Toribio Fortune, MD Gastroenterology and Hepatology [facility] Gastroenter[facility]

## 2024-03-04 NOTE — Patient Instructions (Addendum)
-  try Protonix  40mg  every other day, if you have symptoms, you can resume daily dosing -Increase water intake, aim for atleast 64 oz per day Increase fruits, veggies and whole grains, kiwi and prunes are especially good for constipation -continue miralax as needed, you can also try metamucil BID as this may be beneficial in keeping bowels moving more regularly  -good reflux precautions to include being mindful of greasy, spicy, fried, citrus foods, and be caffeine, carbonated drinks, chocolate and alcohol as these can increase reflux symptoms Stay upright 2-3 hours after eating, prior to lying down and avoid eating late in the evenings.    Follow up 1 year  It was a pleasure to see you today. I want to create trusting relationships with patients and provide genuine, compassionate, and quality care. I truly value your feedback! please be on the lookout for a survey regarding your visit with me today. I appreciate your input about our visit and your time in completing this!    Karrina Lye L. Luvena Wentling, MSN, APRN, AGNP-C Adult-Gerontology Nurse Practitioner [facility] at [facility]

## 2024-03-11 DIAGNOSIS — R030 Elevated blood-pressure reading, without diagnosis of hypertension: Secondary | ICD-10-CM | POA: Diagnosis not present

## 2024-03-14 IMAGING — CT CT ABD-PELV W/ CM
2 of 5 series · 16 of 46 positions shown, 18 images · IV contrast (Omnipaque or Isovue)
Comparison: CT Abdomen and Pelvis 09/18/2019.

CLINICAL DATA: 55-year-old female with abdominal distension,
postprandial, for several weeks.

EXAM:
CT ABDOMEN AND PELVIS WITH CONTRAST
TECHNIQUE: Multidetector CT imaging of the abdomen and pelvis was performed
using the standard protocol following bolus administration of
intravenous contrast.

[Series 2: axial st · axial · 0.85mm/px · z∈[+1047,+1467]mm · 13 of 96 slices shown, 15 images]
[im 6/96  soft-tissue]
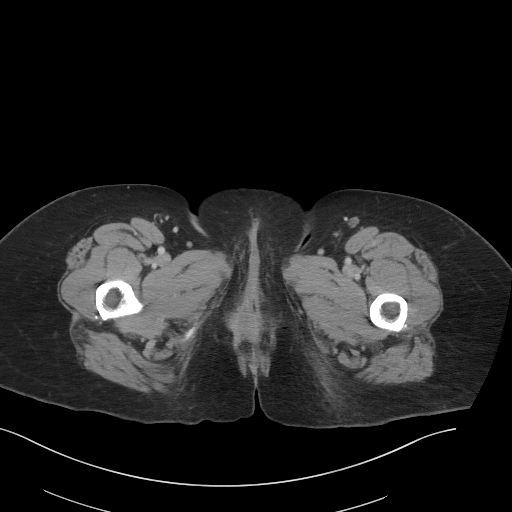
[im 6/96  bone]
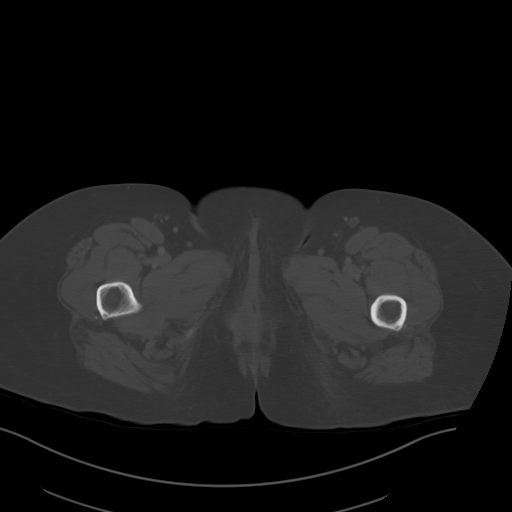
[im 12/96  soft-tissue]
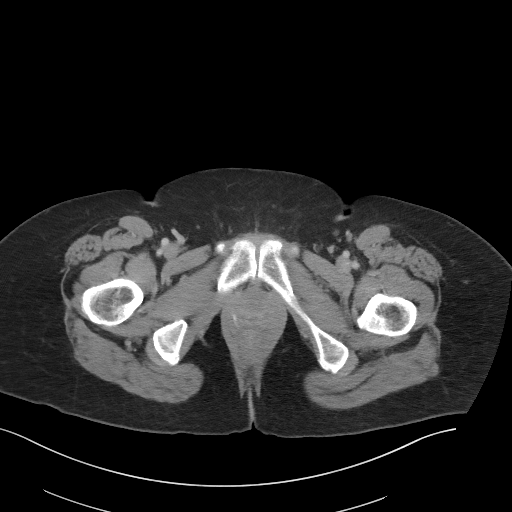
[im 23/96  soft-tissue]
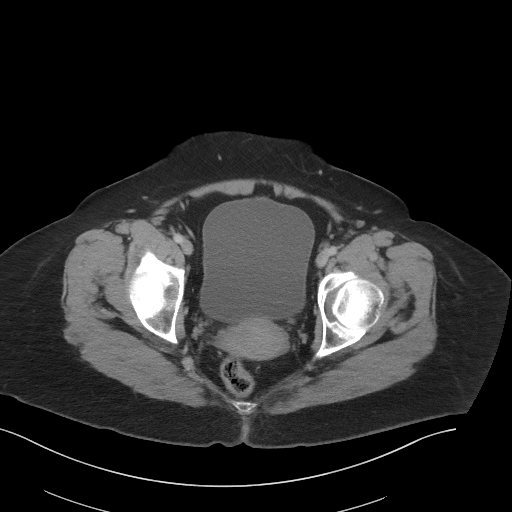
[im 28/96  soft-tissue]
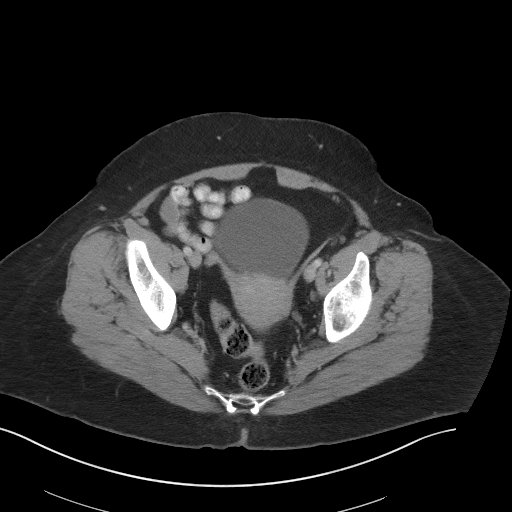
[im 34/96  soft-tissue]
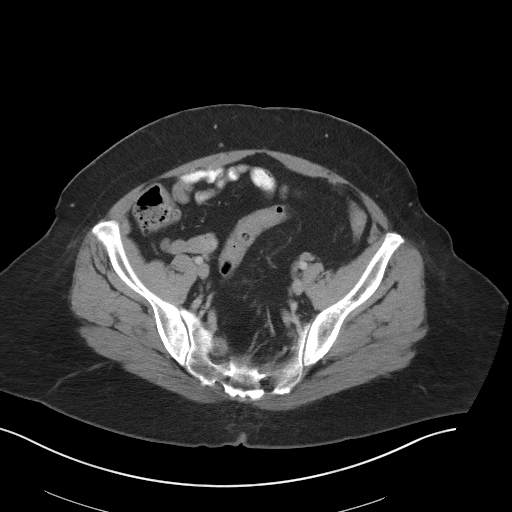
[im 40/96  soft-tissue]
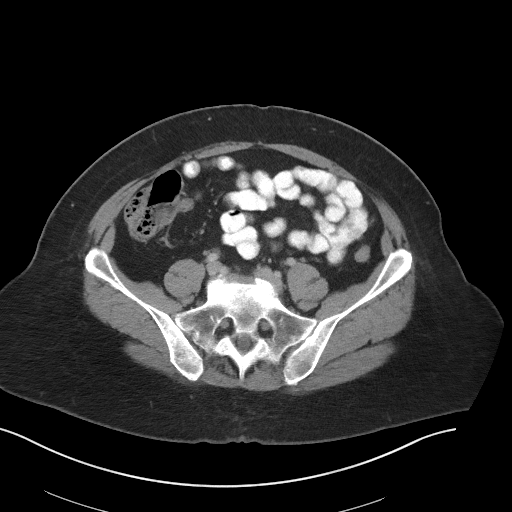
[im 51/96  soft-tissue]
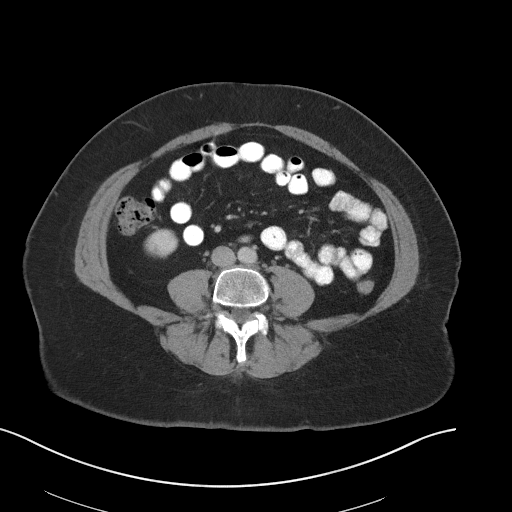
[im 56/96  soft-tissue]
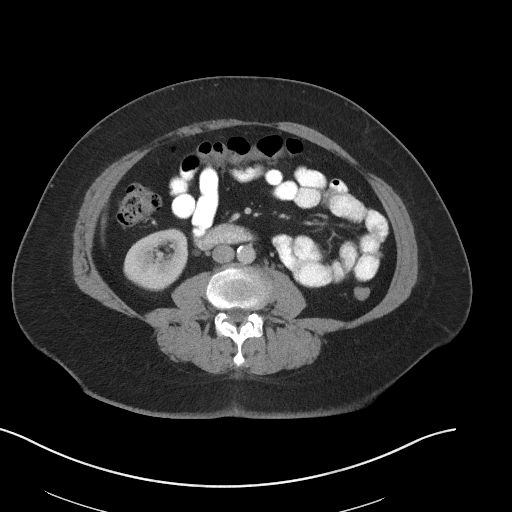
[im 62/96  soft-tissue]
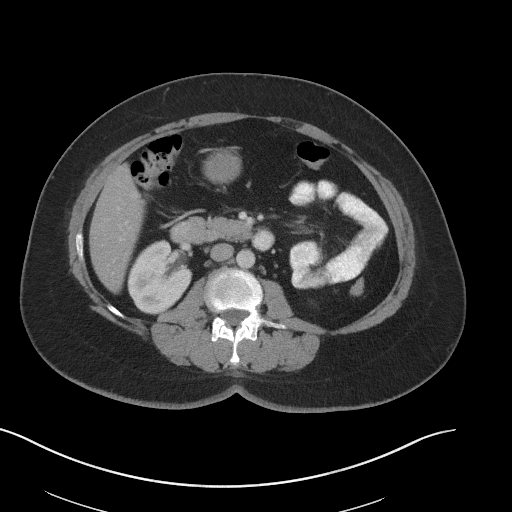
[im 62/96  bone]
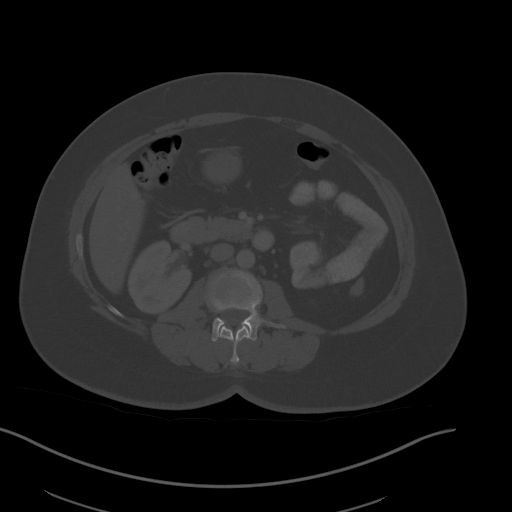
[im 68/96  soft-tissue]
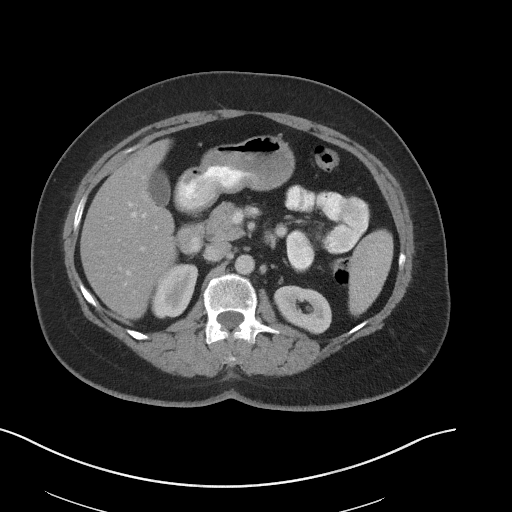
[im 73/96  soft-tissue]
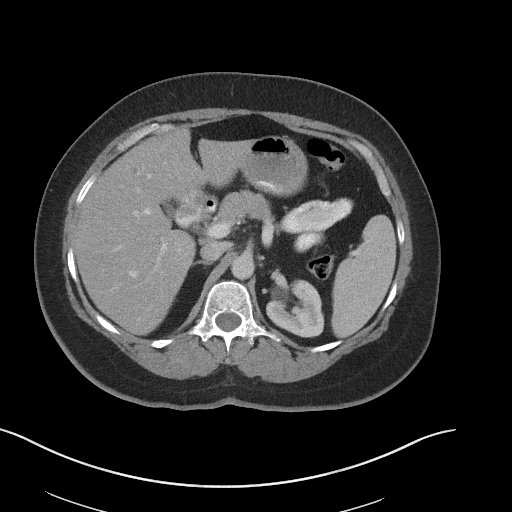
[im 84/96  soft-tissue]
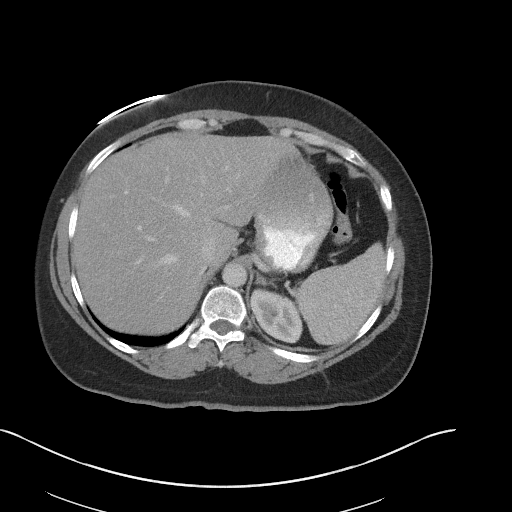
[im 90/96  soft-tissue]
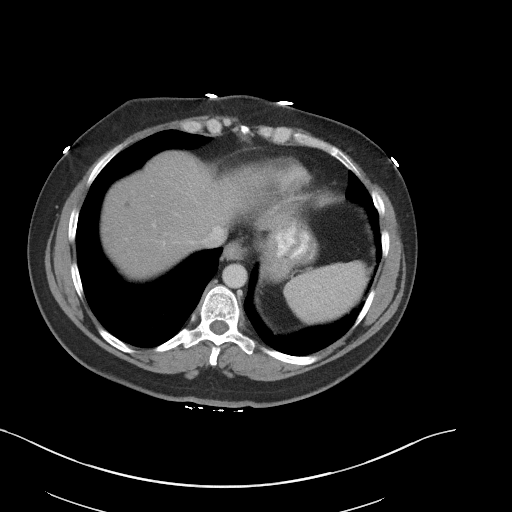

[Series 5: coronal st · coronal · 0.83mm/px · 3 of 117 slices shown]
[im 39/117  soft-tissue]
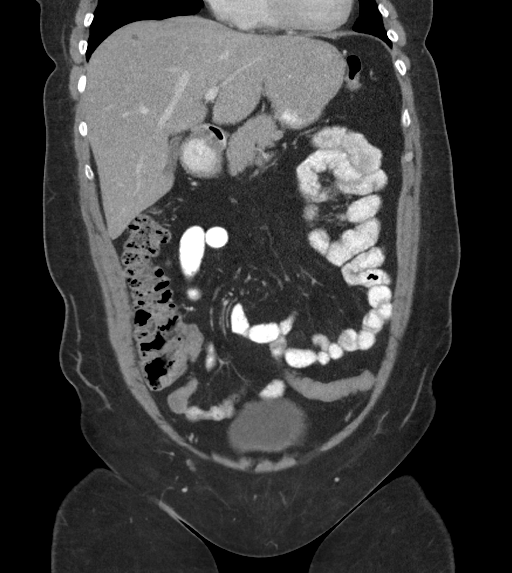
[im 52/117  soft-tissue]
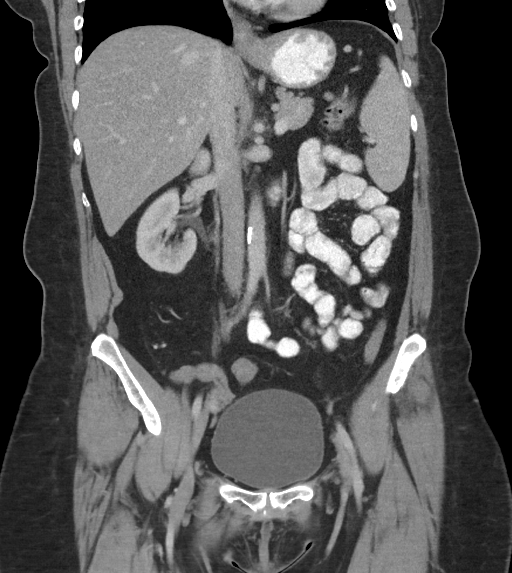
[im 65/117  soft-tissue]
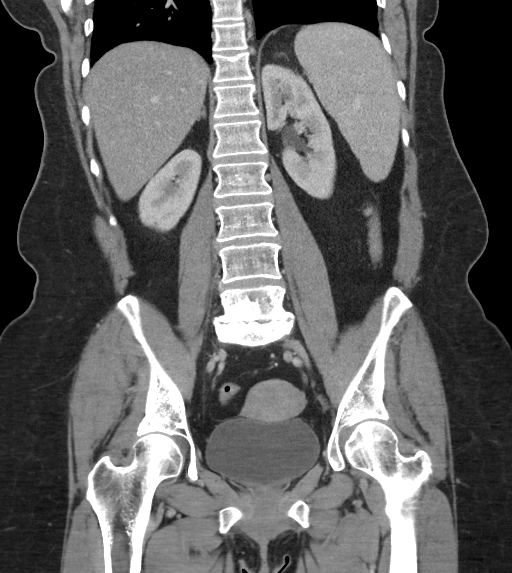

[16 of 46 positions shown; findings below may reference images not displayed]

RADIATION DOSE REDUCTION: This exam was performed according to the
departmental dose-optimization program which includes automated
exposure control, adjustment of the mA and/or kV according to
patient size and/or use of iterative reconstruction technique.

CONTRAST:  100mL OMNIPAQUE IOHEXOL 300 MG/ML  SOLN
FINDINGS: Lower chest: Negative.

Hepatobiliary: Hepatic steatosis is more apparent than in 6568.
Several tiny low-density but circumscribed foci in the liver are
stable and most likely benign cysts (no follow-up imaging
recommended). Negative gallbladder.

Pancreas: Negative.

Spleen: Negative.

Adrenals/Urinary Tract: Normal adrenal glands. Nonobstructed kidneys
with symmetric renal enhancement and contrast excretion. No
nephrolithiasis or pararenal inflammation. Decompressed ureters.
Mildly distended but otherwise unremarkable bladder. Occasional
pelvic phleboliths.

Stomach/Bowel: Decompressed large bowel from the splenic flexure
distally. Mild diverticulosis in those segments. Mild upstream
retained stool. Normal appendix on series 5, image 47. No large
bowel inflammation. Decompressed terminal ileum. Most small bowel
loops contain oral contrast and appear normal. No dilated bowel.
Unremarkable stomach and duodenum. No free air or free fluid. No
mesenteric inflammation. Small chronic fat containing umbilical
hernia.

Vascular/Lymphatic: Normal caliber abdominal aorta. Minimal distal
aortic calcified plaque. Major arterial structures appear patent.
Patent portal venous system. No lymphadenopathy identified.

Reproductive: Negative.

Other: No pelvic free fluid.

Musculoskeletal: Chronic disc and endplate degeneration at the
lumbosacral junction. Healed right L2 transverse process fracture
since 6568. Stable mild chronic T11 superior endplate compression or
Schmorl's node. No acute osseous abnormality identified.
IMPRESSION: 1. No acute or inflammatory process identified in the abdomen or
pelvis.
2. Hepatic steatosis.

## 2024-03-20 ENCOUNTER — Other Ambulatory Visit (HOSPITAL_COMMUNITY): Payer: Self-pay

## 2024-03-20 ENCOUNTER — Other Ambulatory Visit: Payer: Self-pay

## 2024-03-20 DIAGNOSIS — Z01419 Encounter for gynecological examination (general) (routine) without abnormal findings: Secondary | ICD-10-CM | POA: Diagnosis not present

## 2024-03-20 MED ORDER — BACLOFEN 5 MG PO TABS
5.0000 mg | ORAL_TABLET | Freq: Three times a day (TID) | ORAL | 1 refills | Status: AC | PRN
  Filled 2024-03-20: qty 90, 30d supply, fill #0

## 2024-03-20 MED ORDER — VALACYCLOVIR HCL 1 G PO TABS
1000.0000 mg | ORAL_TABLET | Freq: Every day | ORAL | 2 refills | Status: AC
  Filled 2024-03-20: qty 90, 90d supply, fill #0

## 2024-04-30 ENCOUNTER — Encounter (INDEPENDENT_AMBULATORY_CARE_PROVIDER_SITE_OTHER): Payer: Self-pay | Admitting: Gastroenterology

## 2024-07-15 ENCOUNTER — Other Ambulatory Visit: Payer: Self-pay

## 2024-07-15 ENCOUNTER — Other Ambulatory Visit (INDEPENDENT_AMBULATORY_CARE_PROVIDER_SITE_OTHER): Payer: Self-pay | Admitting: Gastroenterology

## 2024-07-15 ENCOUNTER — Other Ambulatory Visit (HOSPITAL_COMMUNITY): Payer: Self-pay

## 2024-07-15 DIAGNOSIS — R12 Heartburn: Secondary | ICD-10-CM

## 2024-07-15 MED ORDER — PANTOPRAZOLE SODIUM 40 MG PO TBEC
40.0000 mg | DELAYED_RELEASE_TABLET | Freq: Every day | ORAL | 2 refills | Status: AC
  Filled 2024-07-15: qty 90, 90d supply, fill #0
